# Patient Record
Sex: Female | Born: 1990 | Race: White | Hispanic: No | Marital: Married | Smoking: Never smoker
Health system: Southern US, Community
[De-identification: ages and names within clinical notes are randomized; demographics above are authoritative.]

## PROBLEM LIST (undated history)

## (undated) DIAGNOSIS — F329 Major depressive disorder, single episode, unspecified: Secondary | ICD-10-CM

## (undated) DIAGNOSIS — Z803 Family history of malignant neoplasm of breast: Secondary | ICD-10-CM

## (undated) DIAGNOSIS — Z8481 Family history of carrier of genetic disease: Secondary | ICD-10-CM

## (undated) DIAGNOSIS — Z8052 Family history of malignant neoplasm of bladder: Secondary | ICD-10-CM

## (undated) DIAGNOSIS — Z807 Family history of other malignant neoplasms of lymphoid, hematopoietic and related tissues: Secondary | ICD-10-CM

## (undated) DIAGNOSIS — F32A Depression, unspecified: Secondary | ICD-10-CM

## (undated) HISTORY — DX: Family history of malignant neoplasm of breast: Z80.3

## (undated) HISTORY — DX: Family history of carrier of genetic disease: Z84.81

## (undated) HISTORY — DX: Family history of malignant neoplasm of bladder: Z80.52

## (undated) HISTORY — PX: OTHER SURGICAL HISTORY: SHX169

## (undated) HISTORY — DX: Family history of other malignant neoplasms of lymphoid, hematopoietic and related tissues: Z80.7

## (undated) HISTORY — DX: Depression, unspecified: F32.A

## (undated) HISTORY — DX: Major depressive disorder, single episode, unspecified: F32.9

---

## 2017-06-27 ENCOUNTER — Other Ambulatory Visit: Payer: Self-pay

## 2017-06-27 ENCOUNTER — Ambulatory Visit (INDEPENDENT_AMBULATORY_CARE_PROVIDER_SITE_OTHER): Payer: 59 | Admitting: Family Medicine

## 2017-06-27 ENCOUNTER — Encounter: Payer: Self-pay | Admitting: Family Medicine

## 2017-06-27 VITALS — BP 110/68 | HR 102 | Temp 98.5°F | Ht 67.0 in | Wt 121.6 lb

## 2017-06-27 DIAGNOSIS — L309 Dermatitis, unspecified: Secondary | ICD-10-CM

## 2017-06-27 DIAGNOSIS — B354 Tinea corporis: Secondary | ICD-10-CM | POA: Diagnosis not present

## 2017-06-27 LAB — POCT SKIN KOH: SKIN KOH, POC: NEGATIVE

## 2017-06-27 MED ORDER — CLOTRIMAZOLE-BETAMETHASONE 1-0.05 % EX CREA: 1.0000 "application " | TOPICAL_CREAM | Freq: Two times a day (BID) | CUTANEOUS | 1 refills | Status: DC

## 2017-06-27 NOTE — Progress Notes (Signed)
Subjective:  By signing my name below, I, [name], attest that this documentation has been prepared under the direction and in the presence of [name], MD. Electronically Signed: [name], [place]. 06/27/2017 , 4:24 PM .  Patient was seen in Room 11 .   Patient ID: April Russo, female    DOB: 1990/09/25, 27 y.o.   MRN: [id number] Chief Complaint  Patient presents with  . Establish Care    ring worm on both hands. (red round spots and going on since begining of march)   HPI April Russo is a 27 y.o. female  Patient complains having an itchy rash over bilateral hands that started in March, and thought it was ringworm. She's applied lotrimin and OTC wash for her hands about 4 weeks intermittently now. She reports covering her hands with [employer]M brand waterproof bandaids and then nitrile gloves for work. She has history of eczema. She uses Dove body wash and generic hand soap.   She's noticed small patches over dorsum right forearm, right upper arm, and the insides of both ankles as well. She's noticed areas improved after a few days off work. 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   There are no active problems to display for this patient.  Past Medical History:  Diagnosis Date  . Depression     No Known Allergies Prior to Admission medications   Not on File   Social History   Socioeconomic History  . Marital status: Married    Spouse name: Not on file  . Number of children: Not on file  . Years of education: Not on file  . Highest education level: Not on file  Occupational History  . Not on file  Social Needs  . Financial resource strain: Not on file  . Food insecurity:    Worry: Not on file    Inability: Not on file  . Transportation needs:    Medical: Not on file    Non-medical: Not on file  Tobacco Use  . Smoking status: Never Smoker  . Smokeless tobacco: Never Used  Substance and Sexual Activity  . Alcohol use: Yes    Comment: [place]     . Drug use: Never  . Sexual activity: Yes  Lifestyle  . Physical activity:    Days per week: Not on file    Minutes per session: Not on file  . Stress: Not on file  Relationships  . Social connections:    Talks on phone: Not on file    Gets together: Not on file    Attends religious service: Not on file    Active member of club or organization: Not on file    Attends meetings of clubs or organizations: Not on file    Relationship status: Not on file  . Intimate partner violence:    Fear of current or ex partner: Not on file    Emotionally abused: Not on file    Physically abused: Not on file    Forced sexual activity: Not on file  Other Topics Concern  . Not on file  Social History Narrative  . Not on file   Review of Systems  Constitutional: Negative for chills, fatigue, fever and unexpected weight change.  Respiratory: Negative for cough.   Gastrointestinal: Negative for constipation, diarrhea, nausea and vomiting.  Skin: Positive for rash. Negative for wound.  Neurological: Negative for dizziness, weakness and headaches.       Objective:   Physical Exam  Constitutional: She is oriented to person, place, and time. She appears well-developed and well-nourished. No distress.  HENT:  Head: Normocephalic and atraumatic.  Eyes: Pupils are equal, round, and reactive to light. EOM are normal.  Neck: Neck supple.  Cardiovascular: Normal rate.  Pulmonary/Chest: Effort normal. No respiratory distress.  Musculoskeletal: Normal range of motion.  Neurological: She is alert and oriented to person, place, and time.  Skin: Skin is warm and dry. Rash noted.  Dorsum of hands: faint erythematous patch over left 5th finger, 2 very faint erythematous patches with slight central clearing; smaller patch at base of left thumb Right hand: larger patch of faint erythema, slight central clearing at the dorsum of the hand before the index finger, smaller patch over the 4th and 5th metacarpal;  no interdigital lesions Right forearm, dorsum: faint healing patch that measures about 1cm across Right upper arm: faint healing patch that measures about 20mm across  Psychiatric: She has a normal mood and affect. Her behavior is normal.  Nursing note and vitals reviewed.   Vitals:   06/27/17 1541  BP: 110/68  Pulse: (!) 102  Temp: 98.5 F (36.9 C)  TempSrc: Oral  SpO2: 99%  Weight: 121 lb 9.6 oz (55.2 kg)  Height: 5\' 7"  (1.702 m)   Results for orders placed or performed in visit on 06/27/17  POCT Skin KOH  Result Value Ref Range   Skin KOH, POC Negative Negative       Assessment & Plan:    April Russo is a 27 y.o. female Hand dermatitis - Plan: clotrimazole-betamethasone (LOTRISONE) cream, POCT Skin KOH  Tinea corporis - Plan: clotrimazole-betamethasone (LOTRISONE) cream, POCT Skin KOH  -Based on location, suspect some component of hand dermatitis, but does have other areas that may be tinea corporis.  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 Initially can try Lotrisone cream as steroid component may help hand dermatitis while also covering for tinea.  -Try to allow air to area as much as possible throughout the day  -Handout given on hand dermatitis as well as tinea  Recheck in the next 2 weeks.  Sooner if worse  Meds ordered this encounter  Medications  . clotrimazole-betamethasone (LOTRISONE) cream    Sig: Apply 1 application topically 2 (two) times daily.    Dispense:  30 g    Refill:  1   Patient Instructions    Try the Lotrisone cream twice per day to affected areas on hands and recheck in 2 weeks.  Try to leave those areas open to air as much as possible when not needed to be covered at work.  If any worsening symptoms, return sooner.  Thank you for coming in today.   Body Ringworm Body ringworm is an infection of the  skin that often causes a ring-shaped rash. Body ringworm can affect any part of your skin. It can spread easily to others. Body ringworm is also called tinea corporis. What are the causes? This condition is caused by funguses called dermatophytes. The condition develops when these funguses grow out of control on the skin. You can get this condition if you touch a person or animal that has it. You can also get it if you share clothing, bedding, towels, or any other object with an infected person or pet. What increases the risk? This condition is more likely to develop in:  Athletes who often make skin-to-skin contact with other athletes, such as wrestlers.  People who share equipment and mats.  People with a weakened immune system.  What are the signs or symptoms? Symptoms of this condition include:  Itchy, raised red spots and bumps.  Red scaly patches.  A ring-shaped rash. The rash may have: ? A clear center. ? Scales or red bumps at its center. ? Redness near its borders. ? Dry and scaly skin on or around it.  How is this diagnosed? This condition can usually be diagnosed with a skin exam. A skin scraping may be taken from the affected area and examined under a microscope to see if the fungus is present. How is this treated? This condition may be treated with:  An antifungal cream or ointment.  An antifungal shampoo.  Antifungal medicines. These may be prescribed if your ringworm is severe, keeps coming back, or lasts a long time.  Follow these instructions at home:  Take over-the-counter and prescription medicines only as told by your health care provider.  If you were given an antifungal cream or ointment: ? Use it as told by your health care provider. ? Wash the infected area and dry it completely before applying the cream or ointment.  If you were given an antifungal shampoo: ? Use it as told by your health care provider. ? Leave the shampoo on your body for 3-5  minutes before rinsing.  While you have a rash: ? Wear loose clothing to stop clothes from rubbing and irritating it. ? Wash or change your bed sheets every night.  If your pet has the same infection, take your pet to see a Animal nutritionist. How is this prevented?  Practice good hygiene.  Wear sandals or shoes in public places and showers.  Do not share personal items with others.  Avoid touching red patches of skin on other people.  Avoid touching pets that have bald spots.  If you touch an animal that has a bald spot, wash your hands. Contact a health care provider if:  Your rash continues to spread after 7 days of treatment.  Your rash is not gone in 4 weeks.  The area around your rash gets red, warm, tender, and swollen. This information is not intended to replace advice given to you by your health care provider. Make sure you discuss any questions you have with your health care provider. Document Released: 01/30/2000 Document Revised: 07/10/2015 Document Reviewed: 11/28/2014 Elsevier Interactive Patient Education  2018 [place] Dermatitis Hand dermatitis is a skin condition that causes small, itchy, raised dots or fluid-filled blisters to form over the palms of the hands. This condition may also be called hand eczema. What are the causes? The cause of this condition is not known. What increases the risk? This condition is more likely to develop in people who have a history of allergies, such as:  Hay fever.  Allergic asthma.  An allergy to latex.  Chemical exposure, injuries, and environmental irritants can make hand dermatitis worse. Washing your hands too often can remove natural oils, which can dry out the skin and contribute to outbreaks of this condition. What are the signs or symptoms? The most common symptom of this condition is intense itchiness. Cracks or grooves (fissures) on the fingers can also develop. Affected areas can be painful, especially  areas where large blisters have formed. How is this diagnosed? This condition is diagnosed with a medical history and physical exam. How is this treated?  This condition is treated with medicines, including:  Steroid creams and ointments.  Oral steroid medicines.  Antibiotic medicines. These are prescribed if you have an infection.  Antihistamine medicines. These help to reduce itchiness.  Follow these instructions at home:  Take or apply over-the-counter and prescription medicines only as told by your health care provider.  If you were prescribed an antibiotic medicine, use it as told by your health care provider. Do not stop using the antibiotic even if you start to feel better.  Avoid washing your hands more often than necessary.  Avoid using harsh chemicals on your hands.  Wear protective gloves when you handle products that can irritate your skin.  Keep all follow-up visits as told by your health care provider. This is important. Contact a health care provider if:  Your rash does not improve during the first week of treatment.  Your rash is red or tender.  Your rash has pus coming from it.  Your rash spreads. This information is not intended to replace advice given to you by your health care provider. Make sure you discuss any questions you have with your health care provider. Document Released: 02/01/2005 Document Revised: 07/10/2015 Document Reviewed: 08/16/2014 Elsevier Interactive Patient Education  2018 [employer].   IF you received an x-ray today, you will receive an invoice from [facility]. Please contact [facility] at [phone] with questions or concerns regarding your invoice.   IF you received labwork today, you will receive an invoice from [place]. Please contact [facility] at [phone] with questions or concerns regarding your invoice.   Our billing staff will not be able to assist you with questions regarding bills from these  companies.  You will be contacted with the lab results as soon as they are available. The fastest way to get your results is to activate your My Chart account. Instructions are located on the last page of this paperwork. If you have not heard from [place] regarding the results in 2 weeks, please contact this office.       I personally performed the services described in this documentation, which was scribed in my presence. The recorded information has been reviewed and considered for accuracy and completeness, addended by me as needed, and agree with information above.  Signed,   [name], MD [facility].  06/29/17 2:28 PM

## 2017-06-27 NOTE — Patient Instructions (Addendum)
Try the Lotrisone cream twice per day to affected areas on hands and recheck in 2 weeks.  Try to leave those areas open to air as much as possible when not needed to be covered at work.  If any worsening symptoms, return sooner.  Thank you for coming in today.   Body Ringworm Body ringworm is an infection of the skin that often causes a ring-shaped rash. Body ringworm can affect any part of your skin. It can spread easily to others. Body ringworm is also called tinea corporis. What are the causes? This condition is caused by funguses called dermatophytes. The condition develops when these funguses grow out of control on the skin. You can get this condition if you touch a person or animal that has it. You can also get it if you share clothing, bedding, towels, or any other object with an infected person or pet. What increases the risk? This condition is more likely to develop in:  Athletes who often make skin-to-skin contact with other athletes, such as wrestlers.  People who share equipment and mats.  People with a weakened immune system.  What are the signs or symptoms? Symptoms of this condition include:  Itchy, raised red spots and bumps.  Red scaly patches.  A ring-shaped rash. The rash may have: ? A clear center. ? Scales or red bumps at its center. ? Redness near its borders. ? Dry and scaly skin on or around it.  How is this diagnosed? This condition can usually be diagnosed with a skin exam. A skin scraping may be taken from the affected area and examined under a microscope to see if the fungus is present. How is this treated? This condition may be treated with:  An antifungal cream or ointment.  An antifungal shampoo.  Antifungal medicines. These may be prescribed if your ringworm is severe, keeps coming back, or lasts a long time.  Follow these instructions at home:  Take over-the-counter and prescription medicines only as told by your health care  provider.  If you were given an antifungal cream or ointment: ? Use it as told by your health care provider. ? Wash the infected area and dry it completely before applying the cream or ointment.  If you were given an antifungal shampoo: ? Use it as told by your health care provider. ? Leave the shampoo on your body for 3-5 minutes before rinsing.  While you have a rash: ? Wear loose clothing to stop clothes from rubbing and irritating it. ? Wash or change your bed sheets every night.  If your pet has the same infection, take your pet to see a Animal nutritionist. How is this prevented?  Practice good hygiene.  Wear sandals or shoes in public places and showers.  Do not share personal items with others.  Avoid touching red patches of skin on other people.  Avoid touching pets that have bald spots.  If you touch an animal that has a bald spot, wash your hands. Contact a health care provider if:  Your rash continues to spread after 7 days of treatment.  Your rash is not gone in 4 weeks.  The area around your rash gets red, warm, tender, and swollen. This information is not intended to replace advice given to you by your health care provider. Make sure you discuss any questions you have with your health care provider. Document Released: 01/30/2000 Document Revised: 07/10/2015 Document Reviewed: 11/28/2014 Elsevier Interactive Patient Education  2018 [place] Dermatitis Hand  dermatitis is a skin condition that causes small, itchy, raised dots or fluid-filled blisters to form over the palms of the hands. This condition may also be called hand eczema. What are the causes? The cause of this condition is not known. What increases the risk? This condition is more likely to develop in people who have a history of allergies, such as:  Hay fever.  Allergic asthma.  An allergy to latex.  Chemical exposure, injuries, and environmental irritants can make hand dermatitis  worse. Washing your hands too often can remove natural oils, which can dry out the skin and contribute to outbreaks of this condition. What are the signs or symptoms? The most common symptom of this condition is intense itchiness. Cracks or grooves (fissures) on the fingers can also develop. Affected areas can be painful, especially areas where large blisters have formed. How is this diagnosed? This condition is diagnosed with a medical history and physical exam. How is this treated? This condition is treated with medicines, including:  Steroid creams and ointments.  Oral steroid medicines.  Antibiotic medicines. These are prescribed if you have an infection.  Antihistamine medicines. These help to reduce itchiness.  Follow these instructions at home:  Take or apply over-the-counter and prescription medicines only as told by your health care provider.  If you were prescribed an antibiotic medicine, use it as told by your health care provider. Do not stop using the antibiotic even if you start to feel better.  Avoid washing your hands more often than necessary.  Avoid using harsh chemicals on your hands.  Wear protective gloves when you handle products that can irritate your skin.  Keep all follow-up visits as told by your health care provider. This is important. Contact a health care provider if:  Your rash does not improve during the first week of treatment.  Your rash is red or tender.  Your rash has pus coming from it.  Your rash spreads. This information is not intended to replace advice given to you by your health care provider. Make sure you discuss any questions you have with your health care provider. Document Released: 02/01/2005 Document Revised: 07/10/2015 Document Reviewed: 08/16/2014 Elsevier Interactive Patient Education  2018 [employer].   IF you received an x-ray today, you will receive an invoice from [facility]. Please contact [facility]  Radiology at [phone] with questions or concerns regarding your invoice.   IF you received labwork today, you will receive an invoice from [place]. Please contact [facility] at [phone] with questions or concerns regarding your invoice.   Our billing staff will not be able to assist you with questions regarding bills from these companies.  You will be contacted with the lab results as soon as they are available. The fastest way to get your results is to activate your My Chart account. Instructions are located on the last page of this paperwork. If you have not heard from [place] regarding the results in 2 weeks, please contact this office.

## 2017-06-29 ENCOUNTER — Encounter: Payer: Self-pay | Admitting: Family Medicine

## 2017-07-14 ENCOUNTER — Ambulatory Visit (INDEPENDENT_AMBULATORY_CARE_PROVIDER_SITE_OTHER): Payer: 59 | Admitting: Family Medicine

## 2017-07-14 ENCOUNTER — Other Ambulatory Visit: Payer: Self-pay

## 2017-07-14 ENCOUNTER — Encounter: Payer: Self-pay | Admitting: Family Medicine

## 2017-07-14 VITALS — HR 84 | Temp 98.4°F | Ht 67.0 in | Wt 124.4 lb

## 2017-07-14 DIAGNOSIS — L309 Dermatitis, unspecified: Secondary | ICD-10-CM | POA: Diagnosis not present

## 2017-07-14 MED ORDER — TRIAMCINOLONE ACETONIDE 0.1 % EX CREA: 1.0000 "application " | TOPICAL_CREAM | Freq: Two times a day (BID) | CUTANEOUS | 1 refills | Status: DC

## 2017-07-14 NOTE — Patient Instructions (Addendum)
  Rash on hands appears much better.  Okay to use the Lotrisone for another week or so, but I also wrote for triamcinolone to use as needed in the future as this may be a type of hand eczema.  Thank you for coming in today.  Let me know if we can help further.   Hand Dermatitis Hand dermatitis is a skin condition that causes small, itchy, raised dots or fluid-filled blisters to form over the palms of the hands. This condition may also be called hand eczema. What are the causes? The cause of this condition is not known. What increases the risk? This condition is more likely to develop in people who have a history of allergies, such as:  Hay fever.  Allergic asthma.  An allergy to latex.  Chemical exposure, injuries, and environmental irritants can make hand dermatitis worse. Washing your hands too often can remove natural oils, which can dry out the skin and contribute to outbreaks of this condition. What are the signs or symptoms? The most common symptom of this condition is intense itchiness. Cracks or grooves (fissures) on the fingers can also develop. Affected areas can be painful, especially areas where large blisters have formed. How is this diagnosed? This condition is diagnosed with a medical history and physical exam. How is this treated? This condition is treated with medicines, including:  Steroid creams and ointments.  Oral steroid medicines.  Antibiotic medicines. These are prescribed if you have an infection.  Antihistamine medicines. These help to reduce itchiness.  Follow these instructions at home:  Take or apply over-the-counter and prescription medicines only as told by your health care provider.  If you were prescribed an antibiotic medicine, use it as told by your health care provider. Do not stop using the antibiotic even if you start to feel better.  Avoid washing your hands more often than necessary.  Avoid using harsh chemicals on your hands.  Wear  protective gloves when you handle products that can irritate your skin.  Keep all follow-up visits as told by your health care provider. This is important. Contact a health care provider if:  Your rash does not improve during the first week of treatment.  Your rash is red or tender.  Your rash has pus coming from it.  Your rash spreads. This information is not intended to replace advice given to you by your health care provider. Make sure you discuss any questions you have with your health care provider. Document Released: 02/01/2005 Document Revised: 07/10/2015 Document Reviewed: 08/16/2014 Elsevier Interactive Patient Education  2018 [employer].    IF you received an x-ray today, you will receive an invoice from [facility]. Please contact [facility] Radiology at [phone] with questions or concerns regarding your invoice.   IF you received labwork today, you will receive an invoice from [place]. Please contact [facility] at [phone] with questions or concerns regarding your invoice.   Our billing staff will not be able to assist you with questions regarding bills from these companies.  You will be contacted with the lab results as soon as they are available. The fastest way to get your results is to activate your My Chart account. Instructions are located on the last page of this paperwork. If you have not heard from [place] regarding the results in 2 weeks, please contact this office.

## 2017-07-14 NOTE — Progress Notes (Signed)
Subjective:  By signing my name below, I, April Russo, attest that this documentation has been prepared under the direction and in the presence of April Agreste, MD Electronically Signed: Ladene Artist, ED Scribe 07/14/2017 at 8:58 AM.   Patient ID: April Russo, female    DOB: Apr 23, 1990, 26 y.o.   MRN: [id number]  Chief Complaint  Patient presents with  . Rash    follow up   HPI April Russo is a 27 y.o. female who presents to Primary Care at [facility] for f/u on rash on hands. Suspected contact dermatitis, less likely tinea. Due to her job has to use occlusive bandages and gloves for pt care. Prescribed Lotrisone cream at visit 2 wks ago. Advised to allow skin to breathe as much as possible throughout the day. - Pt states that rash has improved significantly. She initially applied cream 2-3 times/daily, now applies it only at night. States she did have 3 days off work which she also thinks helped some.  There are no active problems to display for this patient.  Past Medical History:  Diagnosis Date  . Depression    Past Surgical History:  Procedure Laterality Date  . wisdom tooth removal Bilateral    No Known Allergies Prior to Admission medications   Medication Sig Start Date End Date Taking? Authorizing Provider  cetirizine (ZYRTEC) 10 MG tablet Take 10 mg by mouth as needed for allergies.   Yes [provider]  clotrimazole-betamethasone (LOTRISONE) cream Apply 1 application topically 2 (two) times daily. 06/27/17  Yes April Agreste, MD   Social History   Socioeconomic History  . Marital status: Married    Spouse name: Not on file  . Number of children: Not on file  . Years of education: Not on file  . Highest education level: Not on file  Occupational History  . Not on file  Social Needs  . Financial resource strain: Not on file  . Food insecurity:    Worry: Not on file    Inability: Not on file  . Transportation needs:    Medical: Not on file   Non-medical: Not on file  Tobacco Use  . Smoking status: Never Smoker  . Smokeless tobacco: Never Used  Substance and Sexual Activity  . Alcohol use: Yes    Comment: [place]   . Drug use: Never  . Sexual activity: Yes  Lifestyle  . Physical activity:    Days per week: Not on file    Minutes per session: Not on file  . Stress: Not on file  Relationships  . Social connections:    Talks on phone: Not on file    Gets together: Not on file    Attends religious service: Not on file    Active member of club or organization: Not on file    Attends meetings of clubs or organizations: Not on file    Relationship status: Not on file  . Intimate partner violence:    Fear of current or ex partner: Not on file    Emotionally abused: Not on file    Physically abused: Not on file    Forced sexual activity: Not on file  Other Topics Concern  . Not on file  Social History Narrative  . Not on file   Review of Systems  Skin: Positive for rash (improving).      Objective:   Physical Exam  Constitutional: She is oriented to person, place, and time. She appears well-developed and well-nourished. No  distress.  HENT:  Head: Normocephalic and atraumatic.  Eyes: Conjunctivae and EOM are normal.  Neck: Neck supple. No tracheal deviation present.  Cardiovascular: Normal rate.  Pulmonary/Chest: Effort normal. No respiratory distress.  Musculoskeletal: Normal range of motion.  Neurological: She is alert and oriented to person, place, and time.  Skin: Skin is warm and dry.  Dorsal hands bilaterally: very faint hypopigmentation patches. Otherwise skin appears intact. Palmar hands appear normal. No interdigital lesions.  Psychiatric: She has a normal mood and affect. Her behavior is normal.  Nursing note and vitals reviewed.  Vitals:   07/14/17 0828  Pulse: 84  Temp: 98.4 F (36.9 C)  TempSrc: Oral  SpO2: 97%  Weight: 124 lb 6.4 oz (56.4 kg)  Height: 5\' 7"  (1.702 m)      Assessment & Plan:     April Russo is a 27 y.o. female Hand dermatitis  -Significantly improved with Lotrisone.  Still suspect this most likely to be a hand dermatitis/eczema.  Okay to continue Lotrisone for the next week to 10 days, then can switch to triamcinolone as needed, which can also be used if needed for other areas of eczema. RTC precautions given.    Meds ordered this encounter  Medications  . triamcinolone cream (KENALOG) 0.1 %    Sig: Apply 1 application topically 2 (two) times daily. As needed    Dispense:  30 g    Refill:  1   Patient Instructions    Rash on hands appears much better.  Okay to use the Lotrisone for another week or so, but I also wrote for triamcinolone to use as needed in the future as this may be a type of hand eczema.  Thank you for coming in today.  Let me know if we can help further.   Hand Dermatitis Hand dermatitis is a skin condition that causes small, itchy, raised dots or fluid-filled blisters to form over the palms of the hands. This condition may also be called hand eczema. What are the causes? The cause of this condition is not known. What increases the risk? This condition is more likely to develop in people who have a history of allergies, such as:  Hay fever.  Allergic asthma.  An allergy to latex.  Chemical exposure, injuries, and environmental irritants can make hand dermatitis worse. Washing your hands too often can remove natural oils, which can dry out the skin and contribute to outbreaks of this condition. What are the signs or symptoms? The most common symptom of this condition is intense itchiness. Cracks or grooves (fissures) on the fingers can also develop. Affected areas can be painful, especially areas where large blisters have formed. How is this diagnosed? This condition is diagnosed with a medical history and physical exam. How is this treated? This condition is treated with medicines, including:  Steroid creams and  ointments.  Oral steroid medicines.  Antibiotic medicines. These are prescribed if you have an infection.  Antihistamine medicines. These help to reduce itchiness.  Follow these instructions at home:  Take or apply over-the-counter and prescription medicines only as told by your health care provider.  If you were prescribed an antibiotic medicine, use it as told by your health care provider. Do not stop using the antibiotic even if you start to feel better.  Avoid washing your hands more often than necessary.  Avoid using harsh chemicals on your hands.  Wear protective gloves when you handle products that can irritate your skin.  Keep all follow-up  visits as told by your health care provider. This is important. Contact a health care provider if:  Your rash does not improve during the first week of treatment.  Your rash is red or tender.  Your rash has pus coming from it.  Your rash spreads. This information is not intended to replace advice given to you by your health care provider. Make sure you discuss any questions you have with your health care provider. Document Released: 02/01/2005 Document Revised: 07/10/2015 Document Reviewed: 08/16/2014 Elsevier Interactive Patient Education  2018 [employer].    IF you received an x-ray today, you will receive an invoice from [facility]. Please contact [facility] at [phone] with questions or concerns regarding your invoice.   IF you received labwork today, you will receive an invoice from [place]. Please contact [facility] at [phone] with questions or concerns regarding your invoice.   Our billing staff will not be able to assist you with questions regarding bills from these companies.  You will be contacted with the lab results as soon as they are available. The fastest way to get your results is to activate your My Chart account. Instructions are located on the last page of this paperwork. If you  have not heard from [place] regarding the results in 2 weeks, please contact this office.       I personally performed the services described in this documentation, which was scribed in my presence. The recorded information has been reviewed and considered for accuracy and completeness, addended by me as needed, and agree with information above.  Signed,   [name], MD [facility] at [place].  07/14/17 9:03 AM

## 2018-02-03 ENCOUNTER — Other Ambulatory Visit: Payer: Self-pay

## 2018-02-03 ENCOUNTER — Ambulatory Visit (INDEPENDENT_AMBULATORY_CARE_PROVIDER_SITE_OTHER): Payer: 59 | Admitting: Emergency Medicine

## 2018-02-03 ENCOUNTER — Encounter: Payer: Self-pay | Admitting: Emergency Medicine

## 2018-02-03 VITALS — BP 98/65 | HR 88 | Temp 98.9°F | Resp 16 | Ht 67.25 in | Wt 123.6 lb

## 2018-02-03 DIAGNOSIS — R0981 Nasal congestion: Secondary | ICD-10-CM | POA: Insufficient documentation

## 2018-02-03 DIAGNOSIS — J029 Acute pharyngitis, unspecified: Secondary | ICD-10-CM | POA: Insufficient documentation

## 2018-02-03 DIAGNOSIS — R6889 Other general symptoms and signs: Secondary | ICD-10-CM | POA: Insufficient documentation

## 2018-02-03 DIAGNOSIS — J111 Influenza due to unidentified influenza virus with other respiratory manifestations: Secondary | ICD-10-CM

## 2018-02-03 LAB — POC INFLUENZA A&B (BINAX/QUICKVUE)
Influenza A, POC: POSITIVE — AB
Influenza B, POC: POSITIVE — AB

## 2018-02-03 LAB — POCT RAPID STREP A (OFFICE): Rapid Strep A Screen: NEGATIVE

## 2018-02-03 MED ORDER — PSEUDOEPHEDRINE-GUAIFENESIN ER 60-600 MG PO TB12: 1.0000 | ORAL_TABLET | Freq: Two times a day (BID) | ORAL | 1 refills | Status: AC

## 2018-02-03 NOTE — Progress Notes (Signed)
April Russo 27 y.o.   Chief Complaint  Patient presents with  . Cough    x 4days productive with yellow mucus  . Sore Throat    and Triamcinolone cream refill    HISTORY OF PRESENT ILLNESS: This is a 27 y.o. female complaining of flulike symptoms that started 4 days ago.  Complaining of tender throat along with joints hurting, generalized body aches, productive cough and general malaise.  HPI   Prior to Admission medications   Medication Sig Start Date End Date Taking? Authorizing Provider  clotrimazole-betamethasone (LOTRISONE) cream Apply 1 application topically 2 (two) times daily. 06/27/17  Yes [name], MD  triamcinolone cream (KENALOG) 0.1 % Apply 1 application topically 2 (two) times daily. As needed 07/14/17  Yes [name], MD  cetirizine (ZYRTEC) 10 MG tablet Take 10 mg by mouth as needed for allergies.    [provider]    No Known Allergies  There are no active problems to display for this patient.   Past Medical History:  Diagnosis Date  . Depression     Past Surgical History:  Procedure Laterality Date  . wisdom tooth removal Bilateral     Social History   Socioeconomic History  . Marital status: Married    Spouse name: Not on file  . Number of children: Not on file  . Years of education: Not on file  . Highest education level: Not on file  Occupational History  . Not on file  Social Needs  . Financial resource strain: Not on file  . Food insecurity:    Worry: Not on file    Inability: Not on file  . Transportation needs:    Medical: Not on file    Non-medical: Not on file  Tobacco Use  . Smoking status: Never Smoker  . Smokeless tobacco: Never Used  Substance and Sexual Activity  . Alcohol use: Yes    Comment: [place]   . Drug use: Never  . Sexual activity: Yes  Lifestyle  . Physical activity:    Days per week: Not on file    Minutes per session: Not on file  . Stress: Not on file  Relationships  . Social  connections:    Talks on phone: Not on file    Gets together: Not on file    Attends religious service: Not on file    Active member of club or organization: Not on file    Attends meetings of clubs or organizations: Not on file    Relationship status: Not on file  . Intimate partner violence:    Fear of current or ex partner: Not on file    Emotionally abused: Not on file    Physically abused: Not on file    Forced sexual activity: Not on file  Other Topics Concern  . Not on file  Social History Narrative  . Not on file    Family History  Problem Relation Age of Onset  . Cancer Maternal Grandmother   . Cancer Maternal Grandfather   . Cancer Paternal Grandmother   . Heart disease Paternal Grandfather      Review of Systems  Constitutional: Positive for malaise/fatigue. Negative for fever.  HENT: Positive for sore throat.   Eyes: Negative.  Negative for discharge and redness.  Respiratory: Positive for cough.   Cardiovascular: Negative.  Negative for chest pain and palpitations.  Gastrointestinal: Negative.  Negative for abdominal pain, diarrhea, nausea and vomiting.  Genitourinary: Negative.   Musculoskeletal:  Positive for joint pain and myalgias.  Skin: Negative.   Neurological: Negative for dizziness and headaches.  Endo/Heme/Allergies: Negative.   All other systems reviewed and are negative.   Vitals:   02/03/18 0950  BP: 98/65  Pulse: 88  Resp: 16  Temp: 98.9 F (37.2 C)  SpO2: 99%    Physical Exam Vitals signs reviewed.  Constitutional:      Appearance: She is well-developed.  HENT:     Head: Normocephalic and atraumatic.     Mouth/Throat:     Mouth: Mucous membranes are dry.     Pharynx: Uvula midline. Posterior oropharyngeal erythema present. No pharyngeal swelling or oropharyngeal exudate.  Eyes:     Conjunctiva/sclera: Conjunctivae normal.     Pupils: Pupils are equal, round, and reactive to light.  Neck:     Musculoskeletal: Normal range of  motion and neck supple.  Cardiovascular:     Rate and Rhythm: Normal rate and regular rhythm.     Heart sounds: Normal heart sounds. No murmur.  Pulmonary:     Effort: Pulmonary effort is normal.     Breath sounds: Normal breath sounds.  Lymphadenopathy:     Cervical: No cervical adenopathy.  Skin:    General: Skin is warm and dry.     Capillary Refill: Capillary refill takes less than 2 seconds.  Neurological:     General: No focal deficit present.     Mental Status: She is alert and oriented to person, place, and time.  Psychiatric:        Mood and Affect: Mood normal.        Behavior: Behavior normal.    Results for orders placed or performed in visit on 02/03/18 (from the past 24 hour(s))  POCT rapid strep A     Status: None   Collection Time: 02/03/18 10:23 AM  Result Value Ref Range   Rapid Strep A Screen Negative Negative  POC Influenza A&B(BINAX/QUICKVUE)     Status: Abnormal   Collection Time: 02/03/18 10:55 AM  Result Value Ref Range   Influenza A, POC Positive (A) Negative   Influenza B, POC Positive (A) Negative     ASSESSMENT & PLAN: April Russo was seen today for cough and sore throat.  Diagnoses and all orders for this visit:  Influenza  Sore throat -     Culture, Group A Strep -     POCT rapid strep A  Flu-like symptoms -     POC Influenza A&B(BINAX/QUICKVUE)  Sinus congestion -     pseudoephedrine-guaifenesin (MUCINEX D) 60-600 MG 12 hr tablet; Take 1 tablet by mouth every 12 (twelve) hours for 5 days.    Patient Instructions       If you have lab work done today you will be contacted with your lab results within the next 2 weeks.  If you have not heard from [place] then please contact us. The fastest way to get your results is to register for My Chart.   IF you received an x-ray today, you will receive an invoice from [facility]. Please contact [facility] at [phone] with questions or concerns regarding your invoice.   IF  you received labwork today, you will receive an invoice from [place]. Please contact [facility] at [phone] with questions or concerns regarding your invoice.   Our billing staff will not be able to assist you with questions regarding bills from these companies.  You will be contacted with the lab results as soon as they are available.  The fastest way to get your results is to activate your My Chart account. Instructions are located on the last page of this paperwork. If you have not heard from [place] regarding the results in 2 weeks, please contact this office.     Influenza, Adult Influenza is also called "the flu." It is an infection in the lungs, nose, and throat (respiratory tract). It is caused by a virus. The flu causes symptoms that are similar to symptoms of a cold. It also causes a high fever and body aches. The flu spreads easily from person to person (is contagious). Getting a flu shot (influenza vaccination) every year is the best way to prevent the flu. What are the causes? This condition is caused by the influenza virus. You can get the virus by:  Breathing in droplets that are in the air from the cough or sneeze of a person who has the virus.  Touching something that has the virus on it (is contaminated) and then touching your mouth, nose, or eyes. What increases the risk? Certain things may make you more likely to get the flu. These include:  Not washing your hands often.  Having close contact with many people during cold and flu season.  Touching your mouth, eyes, or nose without first washing your hands.  Not getting a flu shot every year. You may have a higher risk for the flu, along with serious problems such as a lung infection (pneumonia), if you:  Are older than 65.  Are pregnant.  Have a weakened disease-fighting system (immune system) because of a disease or taking certain medicines.  Have a long-term (chronic) illness, such as: ? Heart, kidney, or lung  disease. ? Diabetes. ? Asthma.  Have a liver disorder.  Are very overweight (morbidly obese).  Have anemia. This is a condition that affects your red blood cells. What are the signs or symptoms? Symptoms usually begin suddenly and last 4-14 days. They may include:  Fever and chills.  Headaches, body aches, or muscle aches.  Sore throat.  Cough.  Runny or stuffy (congested) nose.  Chest discomfort.  Not wanting to eat as much as normal (poor appetite).  Weakness or feeling tired (fatigue).  Dizziness.  Feeling sick to your stomach (nauseous) or throwing up (vomiting). How is this treated? If the flu is found early, you can be treated with medicine that can help reduce how bad the illness is and how long it lasts (antiviral medicine). This may be given by mouth (orally) or through an IV tube. Taking care of yourself at home can help your symptoms get better. Your doctor may suggest:  Taking over-the-counter medicines.  Drinking plenty of fluids. The flu often goes away on its own. If you have very bad symptoms or other problems, you may be treated in a hospital. Follow these instructions at home:     Activity  Rest as needed. Get plenty of sleep.  Stay home from work or school as told by your doctor. ? Do not leave home until you do not have a fever for 24 hours without taking medicine. ? Leave home only to visit your doctor. Eating and drinking  Take an ORS (oral rehydration solution). This is a drink that is sold at pharmacies and stores.  Drink enough fluid to keep your pee (urine) pale yellow.  Drink clear fluids in small amounts as you are able. Clear fluids include: ? Water. ? Ice chips. ? Fruit juice that has water added (diluted fruit  juice). ? Low-calorie sports drinks.  Eat bland, easy-to-digest foods in small amounts as you are able. These foods include: ? Bananas. ? Applesauce. ? Rice. ? Lean meats. ? Toast. ? Crackers.  Do not eat or  drink: ? Fluids that have a lot of sugar or caffeine. ? Alcohol. ? Spicy or fatty foods. General instructions  Take over-the-counter and prescription medicines only as told by your doctor.  Use a cool mist humidifier to add moisture to the air in your home. This can make it easier for you to breathe.  Cover your mouth and nose when you cough or sneeze.  Wash your hands with soap and water often, especially after you cough or sneeze. If you cannot use soap and water, use alcohol-based hand sanitizer.  Keep all follow-up visits as told by your doctor. This is important. How is this prevented?   Get a flu shot every year. You may get the flu shot in late summer, fall, or winter. Ask your doctor when you should get your flu shot.  Avoid contact with people who are sick during fall and winter (cold and flu season). Contact a doctor if:  You get new symptoms.  You have: ? Chest pain. ? Watery poop (diarrhea). ? A fever.  Your cough gets worse.  You start to have more mucus.  You feel sick to your stomach.  You throw up. Get help right away if you:  Have shortness of breath.  Have trouble breathing.  Have skin or nails that turn a bluish color.  Have very bad pain or stiffness in your neck.  Get a sudden headache.  Get sudden pain in your face or ear.  Cannot eat or drink without throwing up. Summary  Influenza ("the flu") is an infection in the lungs, nose, and throat. It is caused by a virus.  Take over-the-counter and prescription medicines only as told by your doctor.  Getting a flu shot every year is the best way to avoid getting the flu. This information is not intended to replace advice given to you by your health care provider. Make sure you discuss any questions you have with your health care provider. Document Released: 11/11/2007 Document Revised: 07/20/2017 Document Reviewed: 07/20/2017 Elsevier Interactive Patient Education  2019 [employer]      Agustina Caroli, MD [facility]

## 2018-02-03 NOTE — Patient Instructions (Addendum)
   If you have lab work done today you will be contacted with your lab results within the next 2 weeks.  If you have not heard from us then please contact us. The fastest way to get your results is to register for My Chart.   IF you received an x-ray today, you will receive an invoice from [facility]. Please contact [facility] at [phone] with questions or concerns regarding your invoice.   IF you received labwork today, you will receive an invoice from [facility]. Please contact [facility] at [phone] with questions or concerns regarding your invoice.   Our billing staff will not be able to assist you with questions regarding bills from these companies.  You will be contacted with the lab results as soon as they are available. The fastest way to get your results is to activate your My Chart account. Instructions are located on the last page of this paperwork. If you have not heard from us regarding the results in 2 weeks, please contact this office.     Influenza, Adult Influenza is also called "the flu." It is an infection in the lungs, nose, and throat (respiratory tract). It is caused by a virus. The flu causes symptoms that are similar to symptoms of a cold. It also causes a high fever and body aches. The flu spreads easily from person to person (is contagious). Getting a flu shot (influenza vaccination) every year is the best way to prevent the flu. What are the causes? This condition is caused by the influenza virus. You can get the virus by:  Breathing in droplets that are in the air from the cough or sneeze of a person who has the virus.  Touching something that has the virus on it (is contaminated) and then touching your mouth, nose, or eyes. What increases the risk? Certain things may make you more likely to get the flu. These include:  Not washing your hands often.  Having close contact with many people during cold and flu season.  Touching your  mouth, eyes, or nose without first washing your hands.  Not getting a flu shot every year. You may have a higher risk for the flu, along with serious problems such as a lung infection (pneumonia), if you:  Are older than 65.  Are pregnant.  Have a weakened disease-fighting system (immune system) because of a disease or taking certain medicines.  Have a long-term (chronic) illness, such as: ? Heart, kidney, or lung disease. ? Diabetes. ? Asthma.  Have a liver disorder.  Are very overweight (morbidly obese).  Have anemia. This is a condition that affects your red blood cells. What are the signs or symptoms? Symptoms usually begin suddenly and last 4-14 days. They may include:  Fever and chills.  Headaches, body aches, or muscle aches.  Sore throat.  Cough.  Runny or stuffy (congested) nose.  Chest discomfort.  Not wanting to eat as much as normal (poor appetite).  Weakness or feeling tired (fatigue).  Dizziness.  Feeling sick to your stomach (nauseous) or throwing up (vomiting). How is this treated? If the flu is found early, you can be treated with medicine that can help reduce how bad the illness is and how long it lasts (antiviral medicine). This may be given by mouth (orally) or through an IV tube. Taking care of yourself at home can help your symptoms get better. Your doctor may suggest:  Taking over-the-counter medicines.  Drinking plenty of fluids. The flu often   goes away on its own. If you have very bad symptoms or other problems, you may be treated in a hospital. Follow these instructions at home:     Activity  Rest as needed. Get plenty of sleep.  Stay home from work or school as told by your doctor. ? Do not leave home until you do not have a fever for 24 hours without taking medicine. ? Leave home only to visit your doctor. Eating and drinking  Take an ORS (oral rehydration solution). This is a drink that is sold at pharmacies and  stores.  Drink enough fluid to keep your pee (urine) pale yellow.  Drink clear fluids in small amounts as you are able. Clear fluids include: ? Water. ? Ice chips. ? Fruit juice that has water added (diluted fruit juice). ? Low-calorie sports drinks.  Eat bland, easy-to-digest foods in small amounts as you are able. These foods include: ? Bananas. ? Applesauce. ? Rice. ? Lean meats. ? Toast. ? Crackers.  Do not eat or drink: ? Fluids that have a lot of sugar or caffeine. ? Alcohol. ? Spicy or fatty foods. General instructions  Take over-the-counter and prescription medicines only as told by your doctor.  Use a cool mist humidifier to add moisture to the air in your home. This can make it easier for you to breathe.  Cover your mouth and nose when you cough or sneeze.  Wash your hands with soap and water often, especially after you cough or sneeze. If you cannot use soap and water, use alcohol-based hand sanitizer.  Keep all follow-up visits as told by your doctor. This is important. How is this prevented?   Get a flu shot every year. You may get the flu shot in late summer, fall, or winter. Ask your doctor when you should get your flu shot.  Avoid contact with people who are sick during fall and winter (cold and flu season). Contact a doctor if:  You get new symptoms.  You have: ? Chest pain. ? Watery poop (diarrhea). ? A fever.  Your cough gets worse.  You start to have more mucus.  You feel sick to your stomach.  You throw up. Get help right away if you:  Have shortness of breath.  Have trouble breathing.  Have skin or nails that turn a bluish color.  Have very bad pain or stiffness in your neck.  Get a sudden headache.  Get sudden pain in your face or ear.  Cannot eat or drink without throwing up. Summary  Influenza ("the flu") is an infection in the lungs, nose, and throat. It is caused by a virus.  Take over-the-counter and prescription  medicines only as told by your doctor.  Getting a flu shot every year is the best way to avoid getting the flu. This information is not intended to replace advice given to you by your health care provider. Make sure you discuss any questions you have with your health care provider. Document Released: 11/11/2007 Document Revised: 07/20/2017 Document Reviewed: 07/20/2017 [employer] Interactive Patient Education  2019 [employer]  

## 2018-02-06 LAB — CULTURE, GROUP A STREP: STREP A CULTURE: NEGATIVE

## 2018-03-26 ENCOUNTER — Encounter: Payer: Self-pay | Admitting: Family Medicine

## 2018-03-28 MED ORDER — TRIAMCINOLONE ACETONIDE 0.1 % EX CREA: 1.0000 "application " | TOPICAL_CREAM | Freq: Two times a day (BID) | CUTANEOUS | 1 refills | Status: DC

## 2018-04-27 ENCOUNTER — Ambulatory Visit: Payer: Self-pay | Admitting: *Deleted

## 2018-04-27 NOTE — Telephone Encounter (Signed)
Noted  

## 2018-04-27 NOTE — Telephone Encounter (Signed)
Pt called with complaints of dizziness that started on 04/24/2018; she says that the 1st time she noticed it she was putting in eye drops; she says that the dizziness is intermittent; she says that it is most frequent with head position changes, but it has happened without changing positions; the pt normally sees Dr Carlota Raspberry, and she would like to to be seen in the office today by any provider; there is no availbility in the office today; she says that she has to work 7a-7p on 04/28/2018; the pt states that she will go to urgent care; will route to office for notificaton.      Reason for Disposition . [1] MODERATE dizziness (e.g., interferes with normal activities) AND [2] has NOT been evaluated by physician for this  (Exception: dizziness caused by heat exposure, sudden standing, or poor fluid intake)  Answer Assessment - Initial Assessment Questions 1. DESCRIPTION: "Describe your dizziness."     Intermittent room spinning and tilting; lightheaded  2. LIGHTHEADED: "Do you feel lightheaded?" (e.g., somewhat faint, woozy, weak upon standing)     yes 3. VERTIGO: "Do you feel like either you or the room is spinning or tilting?" (i.e. vertigo)     yes 4. SEVERITY: "How bad is it?"  "Do you feel like you are going to faint?" "Can you stand and walk?"   - MILD - walking normally   - MODERATE - interferes with normal activities (e.g., work, school)    - SEVERE - unable to stand, requires support to walk, feels like passing out now.      Mild to moderate 5. ONSET:  "When did the dizziness begin?"     04/24/2018 6. AGGRAVATING FACTORS: "Does anything make it worse?" (e.g., standing, change in head position)     Changing position of her head at times 7. HEART RATE: "Can you tell me your heart rate?" "How many beats in 15 seconds?"  (Note: not all patients can do this)       23 beats in 15 seconds  8. CAUSE: "What do you think is causing the dizziness?"     Not sure 9. RECURRENT SYMPTOM: "Have you had  dizziness before?" If so, ask: "When was the last time?" "What happened that time?"     no 10. OTHER SYMPTOMS: "Do you have any other symptoms?" (e.g., fever, chest pain, vomiting, diarrhea, bleeding)       Pt stumbled once on 04/26/2018 11. PREGNANCY: "Is there any chance you are pregnant?" "When was your last menstrual period?"       LMP 04/04/2018; IUD  Protocols used: DIZZINESS [facility]

## 2018-07-18 ENCOUNTER — Encounter: Payer: 59 | Admitting: Family Medicine

## 2018-07-26 ENCOUNTER — Ambulatory Visit (INDEPENDENT_AMBULATORY_CARE_PROVIDER_SITE_OTHER): Payer: 59 | Admitting: Family Medicine

## 2018-07-26 ENCOUNTER — Other Ambulatory Visit: Payer: Self-pay

## 2018-07-26 ENCOUNTER — Encounter: Payer: Self-pay | Admitting: Family Medicine

## 2018-07-26 VITALS — BP 120/70 | HR 77 | Temp 98.7°F | Resp 12 | Ht 67.0 in | Wt 126.4 lb

## 2018-07-26 DIAGNOSIS — Z0001 Encounter for general adult medical examination with abnormal findings: Secondary | ICD-10-CM | POA: Diagnosis not present

## 2018-07-26 DIAGNOSIS — Z111 Encounter for screening for respiratory tuberculosis: Secondary | ICD-10-CM | POA: Diagnosis not present

## 2018-07-26 DIAGNOSIS — L309 Dermatitis, unspecified: Secondary | ICD-10-CM

## 2018-07-26 DIAGNOSIS — F439 Reaction to severe stress, unspecified: Secondary | ICD-10-CM

## 2018-07-26 DIAGNOSIS — Z0000 Encounter for general adult medical examination without abnormal findings: Secondary | ICD-10-CM

## 2018-07-26 NOTE — Progress Notes (Signed)
Subjective:    Patient ID: April Russo, female    DOB: 1990/11/28, 28 y.o.   MRN: [id number]  HPI April Russo is a 28 y.o. female Presents today for: Chief Complaint  Patient presents with  . Annual Exam    Patient is here today to have an annual physical but also need to have college form filled out and a quantifron tb test   Here for annual exam, and form for [employer] school of nursing. Summer classes for nursing.  Currently nurse tech.  Will also need QuantiFERON gold testing for tuberculosis screening.  Last quantiferon screening through [facility] in 2019. Working at [employer]. Has been staying healthy.   Cancer screening: Cervical cancer screening - last month with Dr. Royston Sinner, normal. On seasonique for contraception. Min initial nausea. Some breakthrough bleeding.  Will reassess through 3 month.  FH: maternal aunt - breast CA, had BRCA mutation. Considering this testing.  Last tdap 2014.  Has not had HPV vaccine - married 3 years. Declines vaccine at this time.    There is no immunization history on file for this patient.  Depression screen [facility] 2/9 07/26/2018 07/14/2017 06/27/2017  Decreased Interest 0 0 0  Down, Depressed, Hopeless 0 0 0  PHQ - 2 Score 0 0 0  some stress with work and pandemic, school year with online work.  Planning on discussion with EAP.  Sleeping ok most of the time.  Feels like needs at least 9 hrs per night  No exam data present  Dental: no regular dentist. Tried one in town, but not happy there.   Exercise:sporadic, 14 hour shifts walking- physical job.   Alcohol - 2-3 drinks per week.  No tobacco.   Prior seasonal allergies - no regular meds.   Hand dermatitis: See prior notes. Doing well overall Occasional small flairs when working.  uses kenalog to affected areas only for a few days only.   Patient Active Problem List   Diagnosis Date Noted  . Influenza 02/03/2018  . Flu-like symptoms 02/03/2018  . Sore throat 02/03/2018  .  Sinus congestion 02/03/2018   Past Medical History:  Diagnosis Date  . Depression    Past Surgical History:  Procedure Laterality Date  . wisdom tooth removal Bilateral    No Known Allergies Prior to Admission medications   Medication Sig Start Date End Date Taking? Authorizing Provider  cetirizine (ZYRTEC) 10 MG tablet Take 10 mg by mouth as needed for allergies.   Yes [provider]  triamcinolone cream (KENALOG) 0.1 % Apply 1 application topically 2 (two) times daily. As needed 03/28/18  Yes [name], MD   Social History   Socioeconomic History  . Marital status: Married    Spouse name: Not on file  . Number of children: Not on file  . Years of education: Not on file  . Highest education level: Not on file  Occupational History  . Not on file  Social Needs  . Financial resource strain: Not on file  . Food insecurity:    Worry: Not on file    Inability: Not on file  . Transportation needs:    Medical: Not on file    Non-medical: Not on file  Tobacco Use  . Smoking status: Never Smoker  . Smokeless tobacco: Never Used  Substance and Sexual Activity  . Alcohol use: Yes    Comment: [place]   . Drug use: Never  . Sexual activity: Yes  Lifestyle  . Physical activity:  Days per week: Not on file    Minutes per session: Not on file  . Stress: Not on file  Relationships  . Social connections:    Talks on phone: Not on file    Gets together: Not on file    Attends religious service: Not on file    Active member of club or organization: Not on file    Attends meetings of clubs or organizations: Not on file    Relationship status: Not on file  . Intimate partner violence:    Fear of current or ex partner: Not on file    Emotionally abused: Not on file    Physically abused: Not on file    Forced sexual activity: Not on file  Other Topics Concern  . Not on file  Social History Narrative  . Not on file    Review of Systems Per HPI.      Objective:   Physical Exam Vitals signs reviewed.  Constitutional:      Appearance: She is well-developed.  HENT:     Head: Normocephalic and atraumatic.     Right Ear: External ear normal.     Left Ear: External ear normal.  Eyes:     Conjunctiva/sclera: Conjunctivae normal.     Pupils: Pupils are equal, round, and reactive to light.  Neck:     Musculoskeletal: Normal range of motion and neck supple.     Thyroid: No thyromegaly.  Cardiovascular:     Rate and Rhythm: Normal rate and regular rhythm.     Heart sounds: Normal heart sounds. No murmur.  Pulmonary:     Effort: Pulmonary effort is normal. No respiratory distress.     Breath sounds: Normal breath sounds. No wheezing.  Abdominal:     General: Bowel sounds are normal.     Palpations: Abdomen is soft.     Tenderness: There is no abdominal tenderness.  Musculoskeletal: Normal range of motion.        General: No tenderness.  Lymphadenopathy:     Cervical: No cervical adenopathy.  Skin:    General: Skin is warm and dry.       Neurological:     Mental Status: She is alert and oriented to person, place, and time.  Psychiatric:        Attention and Perception: Attention normal.        Mood and Affect: Mood normal.        Speech: Speech normal.        Behavior: Behavior normal. Behavior is cooperative.        Thought Content: Thought content normal.    Vitals:   07/26/18 1507  BP: 120/70  Pulse: 77  Resp: 12  Temp: 98.7 F (37.1 C)  TempSrc: Oral  SpO2: 98%  Weight: 126 lb 6.4 oz (57.3 kg)  Height: 5' 7"  (1.702 m)          Assessment & Plan:   April Russo is a 28 y.o. female Annual physical exam - Plan: QuantiFERON-TB Gold Plus  --anticipatory guidance as below in AVS, screening labs above. Health maintenance items as above in HPI discussed/recommended as applicable.   -No concerning findings on exam/history.  QuantiFERON gold was obtained for tuberculosis screening for nursing school.  Situational  stress  -Mild, and has already planned to meet with EAP for recommendations.  Additionally handout given on stress and stress management with RTC precautions if persistent or worsening symptoms.  Hand dermatitis  -Minimal involvement at this time,  and has been controlled with triamcinolone topical as needed.  If more persistent or worsening areas, would increase strength of topical steroid.  No orders of the defined types were placed in this encounter.  Patient Instructions   Schedule dental visit when possible. Here is an option if needed.  [facility]  Address: [address], [place], [place] [address]  [email or web address]  Phone: [phone]  Meeting with EAP may help with stress management. See other info below. If increasing stress  - please let me know so we can discuss further.   Keeping You Healthy  Get These Tests 1. Blood Pressure- Have your blood pressure checked once a year by your health care provider.  Normal blood pressure is 120/80. 2. Weight- Have your body mass index (BMI) calculated to screen for obesity.  BMI is measure of body fat based on height and weight.  You can also calculate your own BMI at GravelBags.[email or web address]. 3. Cholesterol- Have your cholesterol checked every 5 years starting at age 53 then yearly starting at age 30. 61. Chlamydia, HIV, and other sexually transmitted diseases- Get screened every year until age 55, then within three months of each new sexual provider. 5. Pap Test - Every 1-5 years; discuss with your health care provider. 6. Mammogram- Every 1-2 years starting at age 64--50  Take these medicines  Calcium with Vitamin D-Your body needs 1200 mg of Calcium each day and [phone] IU of Vitamin D daily.  Your body can only absorb 500 mg of Calcium at a time so Calcium must be taken in 2 or 3 divided doses throughout the day.  Multivitamin with folic acid- Once daily if it is possible for you to become pregnant.  Get these  Immunizations  Gardasil-Series of three doses; prevents HPV related illness such as genital warts and cervical cancer.  Menactra-Single dose; prevents meningitis.  Tetanus shot- Every 10 years.  Flu shot-Every year.  Take these steps 1. Do not smoke-Your healthcare provider can help you quit.  For tips on how to quit go to www.smokefree.gov or call 1-800 QUITNOW. 2. Be physically active- Exercise 5 days a week for at least 30 minutes.  If you are not already physically active, start slow and gradually work up to 30 minutes of moderate physical activity.  Examples of moderate activity include walking briskly, dancing, swimming, bicycling, etc. 3. Breast Cancer- A self breast exam every month is important for early detection of breast cancer.  For more information and instruction on self breast exams, ask your healthcare provider or https://www.patel.info/. 4. Eat a healthy diet- Eat a variety of healthy foods such as fruits, vegetables, whole grains, low fat milk, low fat cheeses, yogurt, lean meats, poultry and fish, beans, nuts, tofu, etc.  For more information go to www. Thenutritionsource.org 5. Drink alcohol in moderation- Limit alcohol intake to one drink or less per day. Never drink and drive. 6. Depression- Your emotional health is as important as your physical health.  If you're feeling down or losing interest in things you normally enjoy please talk to your healthcare provider about being screened for depression. 7. Dental visit- Brush and floss your teeth twice daily; visit your dentist twice a year. 8. Eye doctor- Get an eye exam at least every 2 years. 9. Helmet use- Always wear a helmet when riding a bicycle, motorcycle, rollerblading or skateboarding. 25. Safe sex- If you may be exposed to sexually transmitted infections, use a condom. 11. Seat belts- Seat belts can save your live;  always wear one. 12. Smoke/Carbon Monoxide detectors- These detectors need to  be installed on the appropriate level of your home. Replace batteries at least once a year. 13. Skin cancer- When out in the sun please cover up and use sunscreen 15 SPF or higher. 14. Violence- If anyone is threatening or hurting you, please tell your healthcare provider.         Stress Stress is a normal reaction to life events. Stress is what you feel when life demands more than you are used to, or more than you think you can handle. Some stress can be useful, such as studying for a test or meeting a deadline at work. Stress that occurs too often or for too long can cause problems. It can affect your emotional health and interfere with relationships and normal daily activities. Too much stress can weaken your body's defense system (immune system) and increase your risk for physical illness. If you already have a medical problem, stress can make it worse. What are the causes? All sorts of life events can cause stress. An event that causes stress for one person may not be stressful for another person. Major life events, whether positive or negative, commonly cause stress. Examples include:  Losing a job or starting a new job.  Losing a loved one.  Moving to a new town or home.  Getting married or divorced.  Having a baby.  Injury or illness. Less obvious life events can also cause stress, especially if they occur day after day or in combination with each other. Examples include:  Working long hours.  Driving in traffic.  Caring for children.  Being in debt.  Being in a difficult relationship. What are the signs or symptoms? Stress can cause emotional symptoms, including:  Anxiety. This is feeling worried, afraid, on edge, overwhelmed, or out of control.  Anger, including irritation or impatience.  Depression. This is feeling sad, down, helpless, or guilty.  Trouble focusing, remembering, or making decisions. Stress can cause physical symptoms, including:  Aches and  pains. These may affect your head, neck, back, stomach, or other areas of your body.  Tight muscles or a clenched jaw.  Low energy.  Trouble sleeping. Stress can cause unhealthy behaviors, including:  Eating to feel better (overeating) or skipping meals.  Working too much or putting off tasks.  Smoking, drinking alcohol, or using drugs to feel better. How is this diagnosed? Stress is diagnosed through an assessment by your health care provider. He or she may diagnose this condition based on:  Your symptoms and any stressful life events.  Your medical history.  Tests to rule out other causes of your symptoms. Depending on your condition, your health care provider may refer you to a specialist for further evaluation. How is this treated?  Stress management techniques are the recommended treatment for stress. Medicine is not typically recommended for the treatment of stress. Techniques to reduce your reaction to stressful life events include:  Stress identification. Monitor yourself for symptoms of stress and identify what causes stress for you. These skills may help you to avoid or prepare for stressful events.  Time management. Set your priorities, keep a calendar of events, and learn to say "no." Taking these actions can help you avoid making too many commitments. Techniques for coping with stress include:  Rethinking the problem. Try to think realistically about stressful events rather than ignoring them or overreacting. Try to find the positives in a stressful situation rather than focusing on the  negatives.  Exercise. Physical exercise can release both physical and emotional tension. The key is to find a form of exercise that you enjoy and do it regularly.  Relaxation techniques. These relax the body and mind. The key is to find one or more that you enjoy and use the technique(s) regularly. Examples include: ? Meditation, deep breathing, or progressive relaxation techniques.  ? Yoga or tai chi. ? Biofeedback, mindfulness techniques, or journaling. ? Listening to music, being out in nature, or participating in other hobbies.  Practicing a healthy lifestyle. Eat a balanced diet, drink plenty of water, limit or avoid caffeine, and get plenty of sleep.  Having a strong support network. Spend time with family, friends, or other people you enjoy being around. Express your feelings and talk things over with someone you trust. Counseling or talk therapy with a mental health professional may be helpful if you are having trouble managing stress on your own. Follow these instructions at home: Lifestyle   Avoid drugs.  Do not use any products that contain nicotine or tobacco, such as cigarettes and e-cigarettes. If you need help quitting, ask your health care provider.  Limit alcohol intake to no more than 1 drink a day for nonpregnant women and 2 drinks a day for men. One drink equals 12 oz of beer, 5 oz of wine, or 1 oz of hard liquor.  Do not use alcohol or drugs to relax.  Eat a balanced diet that includes fresh fruits and vegetables, whole grains, lean meats, fish, eggs, and beans, and low-fat dairy. Avoid processed foods and foods high in added fat, sugar, and salt.  Exercise at least 30 minutes on 5 or more days each week.  Get 7-8 hours of sleep each night. General instructions   Practice stress management techniques as discussed with your health care provider.  Drink enough fluid to keep your urine clear or pale yellow.  Take over-the-counter and prescription medicines only as told by your health care provider.  Keep all follow-up visits as told by your health care provider. This is important. Contact a health care provider if:  Your symptoms get worse.  You have new symptoms.  You feel overwhelmed by your problems and can no longer manage them on your own. Get help right away if:  You have thoughts of hurting yourself or others. If you ever  feel like you may hurt yourself or others, or have thoughts about taking your own life, get help right away. You can go to your nearest emergency department or call:  Your local emergency services (911 in the [place]  A suicide crisis helpline, such as the [facility] at 985 486 4417. This is open 24 hours a day. Summary  Stress is a normal reaction to life events. It can cause problems if it happens too often or for too long.  Practicing stress management techniques is the best way to treat stress.  Counseling or talk therapy with a mental health professional may be helpful if you are having trouble managing stress on your own. This information is not intended to replace advice given to you by your health care provider. Make sure you discuss any questions you have with your health care provider. Document Released: 07/28/2000 Document Revised: 03/24/2016 Document Reviewed: 03/24/2016 Elsevier Interactive Patient Education  [employer].    If you have lab work done today you will be contacted with your lab results within the next 2 weeks.  If you have not heard from  [place] then please contact us. The fastest way to get your results is to register for My Chart.   IF you received an x-ray today, you will receive an invoice from [facility]. Please contact [facility] at [phone] with questions or concerns regarding your invoice.   IF you received labwork today, you will receive an invoice from [facility]. Please contact [facility] at [phone] with questions or concerns regarding your invoice.   Our billing staff will not be able to assist you with questions regarding bills from these companies.  You will be contacted with the lab results as soon as they are available. The fastest way to get your results is to activate your My Chart account. Instructions are located on the last page of this paperwork. If you have not heard from [place] regarding  the results in 2 weeks, please contact this office.       Signed,   [name], MD [facility].  07/27/18 2:47 PM

## 2018-07-26 NOTE — Patient Instructions (Addendum)
Schedule dental visit when possible. Here is an option if needed.  [facility]  Address: [address], [place], [place] [address]  [email or web address]  Phone: [phone]  Meeting with EAP may help with stress management. See other info below. If increasing stress  - please let me know so we can discuss further.   Keeping You Healthy  Get These Tests 1. Blood Pressure- Have your blood pressure checked once a year by your health care provider.  Normal blood pressure is 120/80. 2. Weight- Have your body mass index (BMI) calculated to screen for obesity.  BMI is measure of body fat based on height and weight.  You can also calculate your own BMI at GravelBags.it. 3. Cholesterol- Have your cholesterol checked every 5 years starting at age 30 then yearly starting at age 56. 67. Chlamydia, HIV, and other sexually transmitted diseases- Get screened every year until age 67, then within three months of each new sexual provider. 5. Pap Test - Every 1-5 years; discuss with your health care provider. 6. Mammogram- Every 1-2 years starting at age 72--50  Take these medicines  Calcium with Vitamin D-Your body needs 1200 mg of Calcium each day and [phone] IU of Vitamin D daily.  Your body can only absorb 500 mg of Calcium at a time so Calcium must be taken in 2 or 3 divided doses throughout the day.  Multivitamin with folic acid- Once daily if it is possible for you to become pregnant.  Get these Immunizations  Gardasil-Series of three doses; prevents HPV related illness such as genital warts and cervical cancer.  Menactra-Single dose; prevents meningitis.  Tetanus shot- Every 10 years.  Flu shot-Every year.  Take these steps 1. Do not smoke-Your healthcare provider can help you quit.  For tips on how to quit go to www.smokefree.gov or call 1-800 QUITNOW. 2. Be physically active- Exercise 5 days a week for at least 30 minutes.  If you are not already physically active,  start slow and gradually work up to 30 minutes of moderate physical activity.  Examples of moderate activity include walking briskly, dancing, swimming, bicycling, etc. 3. Breast Cancer- A self breast exam every month is important for early detection of breast cancer.  For more information and instruction on self breast exams, ask your healthcare provider or https://www.patel.info/. 4. Eat a healthy diet- Eat a variety of healthy foods such as fruits, vegetables, whole grains, low fat milk, low fat cheeses, yogurt, lean meats, poultry and fish, beans, nuts, tofu, etc.  For more information go to www. Thenutritionsource.org 5. Drink alcohol in moderation- Limit alcohol intake to one drink or less per day. Never drink and drive. 6. Depression- Your emotional health is as important as your physical health.  If you're feeling down or losing interest in things you normally enjoy please talk to your healthcare provider about being screened for depression. 7. Dental visit- Brush and floss your teeth twice daily; visit your dentist twice a year. 8. Eye doctor- Get an eye exam at least every 2 years. 9. Helmet use- Always wear a helmet when riding a bicycle, motorcycle, rollerblading or skateboarding. 59. Safe sex- If you may be exposed to sexually transmitted infections, use a condom. 11. Seat belts- Seat belts can save your live; always wear one. 12. Smoke/Carbon Monoxide detectors- These detectors need to be installed on the appropriate level of your home. Replace batteries at least once a year. 13. Skin cancer- When out in the sun please cover up and  use sunscreen 15 SPF or higher. 14. Violence- If anyone is threatening or hurting you, please tell your healthcare provider.         Stress Stress is a normal reaction to life events. Stress is what you feel when life demands more than you are used to, or more than you think you can handle. Some stress can be useful, such as  studying for a test or meeting a deadline at work. Stress that occurs too often or for too long can cause problems. It can affect your emotional health and interfere with relationships and normal daily activities. Too much stress can weaken your body's defense system (immune system) and increase your risk for physical illness. If you already have a medical problem, stress can make it worse. What are the causes? All sorts of life events can cause stress. An event that causes stress for one person may not be stressful for another person. Major life events, whether positive or negative, commonly cause stress. Examples include:  Losing a job or starting a new job.  Losing a loved one.  Moving to a new town or home.  Getting married or divorced.  Having a baby.  Injury or illness. Less obvious life events can also cause stress, especially if they occur day after day or in combination with each other. Examples include:  Working long hours.  Driving in traffic.  Caring for children.  Being in debt.  Being in a difficult relationship. What are the signs or symptoms? Stress can cause emotional symptoms, including:  Anxiety. This is feeling worried, afraid, on edge, overwhelmed, or out of control.  Anger, including irritation or impatience.  Depression. This is feeling sad, down, helpless, or guilty.  Trouble focusing, remembering, or making decisions. Stress can cause physical symptoms, including:  Aches and pains. These may affect your head, neck, back, stomach, or other areas of your body.  Tight muscles or a clenched jaw.  Low energy.  Trouble sleeping. Stress can cause unhealthy behaviors, including:  Eating to feel better (overeating) or skipping meals.  Working too much or putting off tasks.  Smoking, drinking alcohol, or using drugs to feel better. How is this diagnosed? Stress is diagnosed through an assessment by your health care provider. He or she may diagnose  this condition based on:  Your symptoms and any stressful life events.  Your medical history.  Tests to rule out other causes of your symptoms. Depending on your condition, your health care provider may refer you to a specialist for further evaluation. How is this treated?  Stress management techniques are the recommended treatment for stress. Medicine is not typically recommended for the treatment of stress. Techniques to reduce your reaction to stressful life events include:  Stress identification. Monitor yourself for symptoms of stress and identify what causes stress for you. These skills may help you to avoid or prepare for stressful events.  Time management. Set your priorities, keep a calendar of events, and learn to say "no." Taking these actions can help you avoid making too many commitments. Techniques for coping with stress include:  Rethinking the problem. Try to think realistically about stressful events rather than ignoring them or overreacting. Try to find the positives in a stressful situation rather than focusing on the negatives.  Exercise. Physical exercise can release both physical and emotional tension. The key is to find a form of exercise that you enjoy and do it regularly.  Relaxation techniques. These relax the body and mind. The key  is to find one or more that you enjoy and use the technique(s) regularly. Examples include: ? Meditation, deep breathing, or progressive relaxation techniques. ? Yoga or tai chi. ? Biofeedback, mindfulness techniques, or journaling. ? Listening to music, being out in nature, or participating in other hobbies.  Practicing a healthy lifestyle. Eat a balanced diet, drink plenty of water, limit or avoid caffeine, and get plenty of sleep.  Having a strong support network. Spend time with family, friends, or other people you enjoy being around. Express your feelings and talk things over with someone you trust. Counseling or talk therapy  with a mental health professional may be helpful if you are having trouble managing stress on your own. Follow these instructions at home: Lifestyle   Avoid drugs.  Do not use any products that contain nicotine or tobacco, such as cigarettes and e-cigarettes. If you need help quitting, ask your health care provider.  Limit alcohol intake to no more than 1 drink a day for nonpregnant women and 2 drinks a day for men. One drink equals 12 oz of beer, 5 oz of wine, or 1 oz of hard liquor.  Do not use alcohol or drugs to relax.  Eat a balanced diet that includes fresh fruits and vegetables, whole grains, lean meats, fish, eggs, and beans, and low-fat dairy. Avoid processed foods and foods high in added fat, sugar, and salt.  Exercise at least 30 minutes on 5 or more days each week.  Get 7-8 hours of sleep each night. General instructions   Practice stress management techniques as discussed with your health care provider.  Drink enough fluid to keep your urine clear or pale yellow.  Take over-the-counter and prescription medicines only as told by your health care provider.  Keep all follow-up visits as told by your health care provider. This is important. Contact a health care provider if:  Your symptoms get worse.  You have new symptoms.  You feel overwhelmed by your problems and can no longer manage them on your own. Get help right away if:  You have thoughts of hurting yourself or others. If you ever feel like you may hurt yourself or others, or have thoughts about taking your own life, get help right away. You can go to your nearest emergency department or call:  Your local emergency services (911 in the [place]  A suicide crisis helpline, such as the [facility] at 604 763 6500. This is open 24 hours a day. Summary  Stress is a normal reaction to life events. It can cause problems if it happens too often or for too long.  Practicing stress  management techniques is the best way to treat stress.  Counseling or talk therapy with a mental health professional may be helpful if you are having trouble managing stress on your own. This information is not intended to replace advice given to you by your health care provider. Make sure you discuss any questions you have with your health care provider. Document Released: 07/28/2000 Document Revised: 03/24/2016 Document Reviewed: 03/24/2016 Elsevier Interactive Patient Education  [employer].    If you have lab work done today you will be contacted with your lab results within the next 2 weeks.  If you have not heard from [place] then please contact us. The fastest way to get your results is to register for My Chart.   IF you received an x-ray today, you will receive an invoice from [facility]. Please contact [facility] at  [phone] with questions or concerns regarding your invoice.   IF you received labwork today, you will receive an invoice from [facility]. Please contact [facility] at [phone] with questions or concerns regarding your invoice.   Our billing staff will not be able to assist you with questions regarding bills from these companies.  You will be contacted with the lab results as soon as they are available. The fastest way to get your results is to activate your My Chart account. Instructions are located on the last page of this paperwork. If you have not heard from [place] regarding the results in 2 weeks, please contact this office.

## 2018-07-30 LAB — QUANTIFERON-TB GOLD PLUS
QuantiFERON Mitogen Value: >10 IU/mL
QuantiFERON Nil Value: 0.01 IU/mL
QuantiFERON TB1 Ag Value: 0.01 IU/mL
QuantiFERON TB2 Ag Value: 0.01 IU/mL
QuantiFERON-TB Gold Plus: NEGATIVE

## 2019-05-28 ENCOUNTER — Encounter: Payer: Self-pay | Admitting: Family Medicine

## 2019-05-28 DIAGNOSIS — L309 Dermatitis, unspecified: Secondary | ICD-10-CM

## 2019-05-30 MED ORDER — TRIAMCINOLONE ACETONIDE 0.1 % EX CREA: 1.0000 "application " | TOPICAL_CREAM | Freq: Two times a day (BID) | CUTANEOUS | 1 refills | Status: DC

## 2019-05-30 NOTE — Telephone Encounter (Signed)
This was discussed at exam in 07/26/18.  Refill ordered.

## 2019-05-30 NOTE — Telephone Encounter (Signed)
Pt requesting refill for Triamcinolone Acetonide 0.1%  Last filled 03/28/2018 Last OV 07/26/2018  Does pt need an appointment for reevaluation?

## 2019-06-26 ENCOUNTER — Other Ambulatory Visit: Payer: Self-pay | Admitting: Emergency Medicine

## 2019-06-26 DIAGNOSIS — Z111 Encounter for screening for respiratory tuberculosis: Secondary | ICD-10-CM

## 2019-06-28 ENCOUNTER — Other Ambulatory Visit: Payer: Self-pay

## 2019-06-28 ENCOUNTER — Ambulatory Visit (INDEPENDENT_AMBULATORY_CARE_PROVIDER_SITE_OTHER): Payer: 59 | Admitting: Family Medicine

## 2019-06-28 DIAGNOSIS — Z111 Encounter for screening for respiratory tuberculosis: Secondary | ICD-10-CM

## 2019-06-28 NOTE — Addendum Note (Signed)
Addended by: Leim Fabry on: 06/28/2019 10:54 AM   Modules accepted: Orders

## 2019-06-30 LAB — QUANTIFERON-TB GOLD PLUS
QuantiFERON Mitogen Value: >10 IU/mL
QuantiFERON Nil Value: 0 IU/mL
QuantiFERON TB1 Ag Value: 0 IU/mL
QuantiFERON TB2 Ag Value: 0 IU/mL
QuantiFERON-TB Gold Plus: NEGATIVE

## 2019-10-10 ENCOUNTER — Other Ambulatory Visit: Payer: Self-pay

## 2019-10-10 ENCOUNTER — Other Ambulatory Visit: Payer: 59

## 2019-10-10 DIAGNOSIS — Z20822 Contact with and (suspected) exposure to covid-19: Secondary | ICD-10-CM

## 2019-10-12 LAB — SARS-COV-2, NAA 2 DAY TAT

## 2019-10-12 LAB — SPECIMEN STATUS REPORT

## 2019-10-12 LAB — NOVEL CORONAVIRUS, NAA: SARS-CoV-2, NAA: NOT DETECTED

## 2019-10-25 ENCOUNTER — Ambulatory Visit: Payer: 59 | Admitting: Family Medicine

## 2020-01-21 ENCOUNTER — Ambulatory Visit (INDEPENDENT_AMBULATORY_CARE_PROVIDER_SITE_OTHER): Payer: 59 | Admitting: Family Medicine

## 2020-01-21 ENCOUNTER — Other Ambulatory Visit: Payer: Self-pay

## 2020-01-21 ENCOUNTER — Ambulatory Visit
Admission: RE | Admit: 2020-01-21 | Discharge: 2020-01-21 | Disposition: A | Discharge status: Home / Self Care | Payer: 59 | Source: Ambulatory Visit | Attending: Family Medicine | Admitting: Family Medicine

## 2020-01-21 ENCOUNTER — Encounter: Payer: Self-pay | Admitting: Family Medicine

## 2020-01-21 VITALS — BP 111/73 | HR 92 | Temp 98.0°F | Ht 67.0 in | Wt 129.0 lb

## 2020-01-21 DIAGNOSIS — M25531 Pain in right wrist: Secondary | ICD-10-CM

## 2020-01-21 NOTE — Patient Instructions (Addendum)
     If you have lab work done today you will be contacted with your lab results within the next 2 weeks.  If you have not heard from [place] then please contact us. The fastest way to get your results is to register for My Chart.   IF you received an x-ray today, you will receive an invoice from [facility]. Please contact [facility] at [phone] with questions or concerns regarding your invoice.   IF you received labwork today, you will receive an invoice from [facility]. Please contact [facility] at [phone] with questions or concerns regarding your invoice.   Our billing staff will not be able to assist you with questions regarding bills from these companies.  You will be contacted with the lab results as soon as they are available. The fastest way to get your results is to activate your My Chart account. Instructions are located on the last page of this paperwork. If you have not heard from [place] regarding the results in 2 weeks, please contact this office.    I will check xray, then likely will refer you to hand specialist. Avoid direct pressure to area for now, as well as forceful extension.

## 2020-01-21 NOTE — Progress Notes (Signed)
Subjective:  Patient ID: April Russo, female    DOB: 02-Dec-1990  Age: 29 y.o. MRN: [id number]  CC:  Chief Complaint  Patient presents with  . Wrist Pain    Pt reports Chronic dorsal wrist pain. pain has been on going for about a year or longer per pt. Pt reports no known injury that coulf have caused the pain. Pt reports that she has a cyst back in high school around where the pain is. pt states she accidently slamed the cyst on the side of a fridge and the cyst dissapeared. pt is unsure if this is related.    HPI April Russo presents for   R Wrist pain: Pain off and on since high school. Had cyst close to area in past, self ruptured after hitting worst on fridge.  R hand dominant.  Sore to apply pressure.  Hurts to apply palmar pressure, push ups. Uses handles to help. Pain with wrist forceful or hyperextension.  No swelling, redness, bruising.  Soccer in HS, goalie, but no known injury.  Some soreness if prolonged computer work. Mouse work.  More sore past month - more days with aches.  No recent change in activities.  Tx: rare ibuprofen - helps a little - once a month.       History Patient Active Problem List   Diagnosis Date Noted  . Influenza 02/03/2018  . Flu-like symptoms 02/03/2018  . Sore throat 02/03/2018  . Sinus congestion 02/03/2018   Past Medical History:  Diagnosis Date  . Depression    Past Surgical History:  Procedure Laterality Date  . wisdom tooth removal Bilateral    No Known Allergies Prior to Admission medications   Medication Sig Start Date End Date Taking? Authorizing Provider  cetirizine (ZYRTEC) 10 MG tablet Take 10 mg by mouth as needed for allergies.   Yes [provider]  norgestimate-ethinyl estradiol ([place] 28) 0.25-35 MG-MCG tablet  07/19/19  Yes [provider]  triamcinolone cream (KENALOG) 0.1 % Apply 1 application topically 2 (two) times daily. As needed 05/30/19  Yes [name], MD   Social History     Socioeconomic History  . Marital status: Married    Spouse name: Not on file  . Number of children: Not on file  . Years of education: Not on file  . Highest education level: Not on file  Occupational History  . Not on file  Tobacco Use  . Smoking status: Never Smoker  . Smokeless tobacco: Never Used  Substance and Sexual Activity  . Alcohol use: Yes    Comment: [place]   . Drug use: Never  . Sexual activity: Yes  Other Topics Concern  . Not on file  Social History Narrative  . Not on file   Social Determinants of Health   Financial Resource Strain:   . Difficulty of Paying Living Expenses: Not on file  Food Insecurity:   . Worried About Charity fundraiser in the Last Year: Not on file  . Ran Out of Food in the Last Year: Not on file  Transportation Needs:   . Lack of Transportation (Medical): Not on file  . Lack of Transportation (Non-Medical): Not on file  Physical Activity:   . Days of Exercise per Week: Not on file  . Minutes of Exercise per Session: Not on file  Stress:   . Feeling of Stress : Not on file  Social Connections:   . Frequency of Communication with Friends and Family: Not on  file  . Frequency of Social Gatherings with Friends and Family: Not on file  . Attends Religious Services: Not on file  . Active Member of Clubs or Organizations: Not on file  . Attends Archivist Meetings: Not on file  . Marital Status: Not on file  Intimate Partner Violence:   . Fear of Current or Ex-Partner: Not on file  . Emotionally Abused: Not on file  . Physically Abused: Not on file  . Sexually Abused: Not on file    Review of Systems   Objective:   Vitals:   01/21/20 1514  BP: 111/73  Pulse: 92  Temp: 98 F (36.7 C)  TempSrc: Temporal  SpO2: 98%  Weight: 129 lb (58.5 kg)  Height: 5\' 7"  (1.702 m)     Physical Exam Constitutional:      General: She is not in acute distress.    Appearance: She is well-developed.  HENT:     Head:  Normocephalic and atraumatic.  Cardiovascular:     Rate and Rhythm: Normal rate.  Pulmonary:     Effort: Pulmonary effort is normal.  Musculoskeletal:     Comments: Right wrist Skin intact, no erythema, no soft tissue swelling.  Neurovascular intact distally with cap refill less than 1 second.  Negative Tinel's, normal thenar bulk. Full range of motion, but discomfort at terminal extension, radial deviation and terminal flexion. Fingers, metacarpals nontender. Scaphoid nontender.  Distal radial ulnar joint  nontender.  Forearm nontender Pain with palpation over the lunate, no crepitus.   Neurological:     Mental Status: She is alert and oriented to person, place, and time.     Assessment & Plan:  April Russo is a 29 y.o. female . Right wrist pain - Plan: DG Wrist Complete Right  -Longstanding issue, appears to be primarily focused at the lunate.  Differential includes [place]/avascular necrosis with previous sports in high school at time of onset but no known specific injuries.  Scapholunate dissociation, or other contributors to dorsal intercalated segmental instability also in differential based on location.  No appreciable click on exam.  Check initial x-ray, then likely hand surgical eval.  Continue activity modification at this time, avoid direct pressure/repetitive pressure to the area.  No orders of the defined types were placed in this encounter.  Patient Instructions       If you have lab work done today you will be contacted with your lab results within the next 2 weeks.  If you have not heard from [place] then please contact us. The fastest way to get your results is to register for My Chart.   IF you received an x-ray today, you will receive an invoice from [facility]. Please contact [facility] at [phone] with questions or concerns regarding your invoice.   IF you received labwork today, you will receive an invoice from [place] [facility]. Please contact  [facility] at [phone] with questions or concerns regarding your invoice.   Our billing staff will not be able to assist you with questions regarding bills from these companies.  You will be contacted with the lab results as soon as they are available. The fastest way to get your results is to activate your My Chart account. Instructions are located on the last page of this paperwork. If you have not heard from [place] regarding the results in 2 weeks, please contact this office.    I will check xray, then likely will refer you to hand specialist. Avoid direct pressure to area  for now, as well as forceful extension.      Signed, [name], MD [facility]

## 2020-01-22 ENCOUNTER — Encounter: Payer: Self-pay | Admitting: Family Medicine

## 2020-01-30 ENCOUNTER — Encounter: Payer: Self-pay | Admitting: Family Medicine

## 2020-05-01 ENCOUNTER — Ambulatory Visit: Payer: 59 | Admitting: Family Medicine

## 2020-05-07 ENCOUNTER — Ambulatory Visit: Payer: 59 | Admitting: Family Medicine

## 2020-08-25 LAB — HM PAP SMEAR: HM Pap smear: NORMAL

## 2020-08-27 ENCOUNTER — Telehealth: Payer: Self-pay | Admitting: Genetic Counselor

## 2020-08-27 NOTE — Telephone Encounter (Signed)
Received a genetic counseling referral from Dr. Melba Coon for fhx of breast cancer. MS. April Russo has been scheduled to see Raquel Sarna on 7/26 at 11am. Pt aware to arrive 15 minutes early.

## 2020-09-09 ENCOUNTER — Inpatient Hospital Stay: Payer: 59

## 2020-09-09 ENCOUNTER — Inpatient Hospital Stay: Payer: 59 | Attending: Genetic Counselor | Admitting: Genetic Counselor

## 2020-09-09 ENCOUNTER — Other Ambulatory Visit: Payer: Self-pay

## 2020-09-09 DIAGNOSIS — Z807 Family history of other malignant neoplasms of lymphoid, hematopoietic and related tissues: Secondary | ICD-10-CM

## 2020-09-09 DIAGNOSIS — Z803 Family history of malignant neoplasm of breast: Secondary | ICD-10-CM

## 2020-09-09 DIAGNOSIS — Z8481 Family history of carrier of genetic disease: Secondary | ICD-10-CM | POA: Diagnosis not present

## 2020-09-09 DIAGNOSIS — Z8052 Family history of malignant neoplasm of bladder: Secondary | ICD-10-CM

## 2020-09-10 ENCOUNTER — Encounter: Payer: Self-pay | Admitting: Genetic Counselor

## 2020-09-10 DIAGNOSIS — Z8052 Family history of malignant neoplasm of bladder: Secondary | ICD-10-CM | POA: Insufficient documentation

## 2020-09-10 DIAGNOSIS — Z803 Family history of malignant neoplasm of breast: Secondary | ICD-10-CM | POA: Insufficient documentation

## 2020-09-10 DIAGNOSIS — Z8481 Family history of carrier of genetic disease: Secondary | ICD-10-CM | POA: Insufficient documentation

## 2020-09-10 DIAGNOSIS — Z807 Family history of other malignant neoplasms of lymphoid, hematopoietic and related tissues: Secondary | ICD-10-CM | POA: Insufficient documentation

## 2020-09-10 NOTE — Progress Notes (Signed)
REFERRING PROVIDER: Janyth Contes, MD [address] [place],  [place] [address]  PRIMARY PROVIDER:  [name], MD  PRIMARY REASON FOR VISIT:  1. Family history of gene mutation   2. Family history of breast cancer   3. Family history of bladder cancer   4. Family history of multiple myeloma      HISTORY OF PRESENT ILLNESS:   April Russo, a 30 y.o. female, was seen for a [facility] cancer genetics consultation at the request of Dr. Sandford Craze due to a family history of a known BRCA1 mutation and cancer.  April Russo presents to clinic today to discuss the possibility of a hereditary predisposition to cancer, genetic testing, and to further clarify her future cancer risks, as well as potential cancer risks for family members.   April Russo does not have a personal history of cancer.    RISK FACTORS:  Menarche was at age 66.  No live births.  OCP use for approximately 2 years.  Ovaries intact: yes.  Hysterectomy: yes.  Menopausal status: premenopausal.  HRT use: 0 years. Colonoscopy: no; not examined. Mammogram within the last year: no. Number of breast biopsies: 0. Up to date with pelvic exams: yes. Any excessive radiation exposure in the past: no.  Past Medical History:  Diagnosis Date   Depression    Family history of bladder cancer    Family history of breast cancer    Family history of gene mutation    BRCA1 c.81-1G>A (IVS2-1G>A)   Family history of multiple myeloma     Past Surgical History:  Procedure Laterality Date   wisdom tooth removal Bilateral     Social History   Socioeconomic History   Marital status: Married    Spouse name: Not on file   Number of children: Not on file   Years of education: Not on file   Highest education level: Not on file  Occupational History   Not on file  Tobacco Use   Smoking status: Never   Smokeless tobacco: Never  Substance and Sexual Activity   Alcohol use: Yes    Comment: [place]    Drug  use: Never   Sexual activity: Yes  Other Topics Concern   Not on file  Social History Narrative   Not on file   Social Determinants of Health   Financial Resource Strain: Not on file  Food Insecurity: Not on file  Transportation Needs: Not on file  Physical Activity: Not on file  Stress: Not on file  Social Connections: Not on file     FAMILY HISTORY:  We obtained a detailed, 4-generation family history.  Significant diagnoses are listed below: Family History  Problem Relation Age of Onset   Stroke Sister 57   Breast cancer Maternal Aunt 23       both breasts, b/l mastectomies, ovaries removed   BRCA 1/2 Maternal Aunt        BRCA1 c.81-1G>A (IVS2-1G>A)   Other Maternal Uncle        BRCA1 negative   Emphysema Maternal Grandmother    Multiple myeloma Paternal Grandmother 61   Heart disease Paternal Grandfather    Bladder Cancer Paternal Grandfather 83   Breast cancer Other        maternal great-aunt (MGF's sister)   Cancer Other        multiple of her mother's cousins   April Russo does not have children. She has two brothers (ages 80 and 28) and three sisters (ages  24-39). None of these relatives have had cancer.  April Russo mother is alive at age 93 without cancer, and she has decided to not have testing for the familial BRCA1 mutation. There is one maternal aunt and seven maternal uncles. Her aunt had bilateral breast cancer in her 75s or 23s and had genetic testing that revealed a mutation in the BRCA1 gene called c.81-1G>A. She had bilateral mastectomies and had her ovaries removed. One uncle had negative genetic testing for the BRCA1 mutation. There is no known cancer among maternal cousins. April Russo maternal grandmother died at age 68 without cancer. Her maternal grandfather died at age 65 without cancer. A maternal great-aunt (MGF's sister) had breast cancer, and multiple maternal first cousins once removed (MGF's nieces) have had cancer.  April Russo father is alive  at age 29 without cancer. There is one paternal aunt and one paternal uncle. There is no known cancer among paternal aunts/uncles or paternal cousins. April Russo paternal grandmother died at age 76 with multiple myeloma (diagnosed age 1). Her paternal grandfather died at age 27 with bladder cancer (diagnosed age 72).   April Russo is aware of previous family history of genetic testing for hereditary cancer risks. Patient's maternal ancestors are of [place] and otherwise unknown descent, and paternal ancestors are of [place], [place], and [place] descent. There is no reported Ashkenazi Jewish ancestry. There is no known consanguinity.  GENETIC COUNSELING ASSESSMENT: April Russo is a 30 y.o. female with a family history of of a known BRCA1 mutation in a maternal aunt. We, therefore, discussed and recommended the following at today's visit.   DISCUSSION:  We discussed that April Russo has a 25% (1 in 4) chance to also have the BRCA1 variant that was discovered in her maternal aunt. We reviewed the cancer risks that are associated with BRCA1 mutations, including an increased risk of female breast, ovariran, pancreatic, female breast, and prostate cancers. Studies show that women with a BRCA1 mutation can have a 40-87% lifetime risk to develop breast cancer, a 40-60% lifetime risk to develop a secondary breast cancer, and up to a 36-53% risk to develop ovarian cancer. Men can have a 1-2% lifetime risk to develop female breast cancer and an increased risk for prostate cancer. Both men and women can also have a 2-3% increased risk for pancreatic cancer. Individuals with a BRCA1 mutation may opt for the following increased cancer screening/risk-reduction options for associated cancer risks per the NCCN guidelines.  The following is recommended for women who carry a BRCA1 mutation: Breast awareness starting at the age of 27 years; women should report any changes to their breasts to their health care provider. Periodic breast  self-exams may facilitate breast self-awareness. Clinical breast exams every 6-12 months, starting at age 42 years Annual breast MRI and annual mammograms starting at age 36 years and 30 years, respectively, or individualized based on age of earliest onset of breast cancer in the family. Consideration of risk reducing mastectomy, which reduces the risk of breast cancer by greater than 90%. Consideration of risk reducing salpingo-oophorectomy (RRSO), ideally between the ages of 25-40, or individualized based on completion of child-bearing years or age of earliest onset of ovarian cancer in the family. RRSO reduces the risk of ovarian cancer by greater than 90% and can reduce the risk of breast cancer by 50% if performed prior to menopause. Women who have undergone risk reducing RRSO have a small risk of peritoneal carcinoma and can consider annual CA-125 surveillance. For women who  still have their ovaries, transvaginal ultrasound and CA-125 testing every 6 months starting at age 69 years, or 5-10 years prior to the earliest age of onset of ovarian cancer in the family can be considered. Studies have not demonstrated that ovarian cancer screening is effective in detecting early ovarian cancer. Consideration of chemoprevention options such as oral contraceptives, Tamoxifen and Raloxifene, if appropriate for an individual.  The following is recommended for both men and women who carry a BRCA1 mutation: Consideration of pancreatic cancer screening if there is a family history of pancreatic cancer Consider investigational imaging and screening studies, when available (e.g. novel imaging technologies, more frequent screening intervals) in the context of a clinical trial  The [employer]  (NCCN) recommends the following for men who carry a BRCA1 mutation: Breast self-exam training and education starting at age 92 years Clinical breast exam, every 12 months, starting at age 53  years Consider annual mammogram screening in men with gynecomastia starting at age 51, or 5 years before the earliest known female breast cancer in the family (whichever comes first) Prostate cancer screening starting at age 96 years (PSA and digital rectal exam)  We discussed that testing is beneficial for several reasons, including knowing about other cancer risks, identifying potential screening and risk-reduction options that may be appropriate, and to understand if other family members could be at risk for cancer and allow them to undergo genetic testing. We reviewed the characteristics, features and inheritance patterns of hereditary cancer syndromes. We also discussed genetic testing, including the appropriate family members to test, the process of testing, insurance coverage, genetic discrimination, and turn-around-time for results. We discussed the implications of a negative vs a positive result.  We recommended April Russo pursue genetic testing for the known familial variant in BRCA1 called c.81-1G>A through [facility]. Based on April Russo's family history of a known BRCA1 mutation and cancer, she meets medical criteria for genetic testing. Despite that she meets criteria, there may still be an out of pocket cost.  We discussed that some people do not want to undergo genetic testing due to fear of genetic discrimination. A federal law called the Genetic Information Non-Discrimination Act (GINA) of 2008 helps protect individuals against genetic discrimination based on their genetic test results. It impacts both health insurance and employment. With health insurance, it protects against increased premiums, being kicked off insurance or being forced to take a test in order to be insured. For employment it protects against hiring, firing and promoting decisions based on genetic test results. Health status due to a cancer diagnosis is not protected under GINA. Additionally, life, disability, and  long-term care insurance is not protected under GINA.    PLAN: April Russo did not wish to pursue genetic testing at today's visit as she would like some time to consider. We understand this decision and remain available to coordinate genetic testing at any time in the future.   Given that there is a known BRCA1 gene mutation in the family and we do not know whether April Russo has inherited it, we recommended that she follow cancer risk management as if she were BRCA1 positive. If she chooses to have genetic testing in the future and tests negative for the familial mutation, she should then return to population screening recommendations.  April Russo questions were answered to her satisfaction today. Our contact information was provided should additional questions or concerns arise. Thank you for the referral and allowing [place] to share in the care of  your patient.   Clint Guy, [place], [facility] Licensed, Certified Dispensing [email or web address] Phone: [phone]  The patient was seen for a total of 40 minutes in face-to-face genetic counseling. Patient was seen alone. This patient was discussed with Drs. Magrinat, Lindi Adie and/or Burr Medico who agrees with the above.    _______________________________________________________________________ For Office Staff:  Number of people involved in session: 1 Was an Intern/ student involved with case: no

## 2020-09-11 ENCOUNTER — Encounter: Payer: Self-pay | Admitting: Genetic Counselor

## 2020-09-17 ENCOUNTER — Telehealth: Payer: Self-pay | Admitting: Genetic Counselor

## 2020-09-17 NOTE — Telephone Encounter (Signed)
Ms. Reggio would like to move forward with genetic testing at this time. We reviewed genetic testing, including the process of testing, insurance coverage and turn-around-time for results. We discussed the implications of a negative, positive and/or variant of uncertain significant result. We recommended Ms. Turbyfill pursue genetic testing for the [facility] Common Hereditary Cancers + RNA panel. The Common Hereditary Cancers Panel offered by [facility] includes sequencing and/or deletion duplication testing of the following 47genes: APC*, ATM*, AXIN2*, BARD1*, BMPR1A*, BRCA1*, BRCA2*, BRIP1*, CDH1*, CDK4, CDKN2A (p14ARF), CDKN2A (p16INK4a), CHEK2*, CTNNA1*, DICER1*, EPCAM (Deletion/duplication testing only), GREM1 (promoter region deletion/duplication testing only), KIT, MEN1*, MLH1*, MSH2*, MSH3*, MSH6*, MUTYH*, NBN*, NF1*, NTHL1, PALB2*, PDGFRA, PMS2*, POLD1*, POLE*, PTEN*, RAD50*, RAD51C*, RAD51D*, SDHB*, SDHC*, SDHD*, SMAD4*, SMARCA4*, STK11*, TP53*, TSC1*, TSC2*, and VHL*.  The following genes were evaluated for sequence changes only: SDHA* and HOXB13 c.251G>A variant only. RNA analysis performed for * genes.   We scheduled a blood draw for 09/23/20 at 9:45am.  After considering the risks, benefits, and limitations, Ms. Eskridge provided informed consent to pursue genetic testing and the blood sample will be sent to [facility] for analysis of the Common Hereditary Cancers + RNA panel. Results should be available within approximately two-three weeks' time, at which point they will be disclosed by telephone to Ms. Loncar, as will any additional recommendations warranted by these results.

## 2020-09-18 ENCOUNTER — Encounter: Payer: Self-pay | Admitting: Family Medicine

## 2020-09-18 ENCOUNTER — Other Ambulatory Visit: Payer: Self-pay

## 2020-09-18 ENCOUNTER — Ambulatory Visit (INDEPENDENT_AMBULATORY_CARE_PROVIDER_SITE_OTHER): Payer: 59 | Admitting: Family Medicine

## 2020-09-18 VITALS — BP 116/77 | HR 66 | Temp 98.3°F | Resp 18 | Ht 67.0 in | Wt 125.6 lb

## 2020-09-18 DIAGNOSIS — Z130 Encounter for screening for diseases of the blood and blood-forming organs and certain disorders involving the immune mechanism: Secondary | ICD-10-CM

## 2020-09-18 DIAGNOSIS — Z131 Encounter for screening for diabetes mellitus: Secondary | ICD-10-CM | POA: Diagnosis not present

## 2020-09-18 DIAGNOSIS — Z13220 Encounter for screening for lipoid disorders: Secondary | ICD-10-CM | POA: Diagnosis not present

## 2020-09-18 DIAGNOSIS — Z0000 Encounter for general adult medical examination without abnormal findings: Secondary | ICD-10-CM

## 2020-09-18 DIAGNOSIS — Z8349 Family history of other endocrine, nutritional and metabolic diseases: Secondary | ICD-10-CM

## 2020-09-18 DIAGNOSIS — D229 Melanocytic nevi, unspecified: Secondary | ICD-10-CM

## 2020-09-18 DIAGNOSIS — Z23 Encounter for immunization: Secondary | ICD-10-CM | POA: Diagnosis not present

## 2020-09-18 DIAGNOSIS — Z1329 Encounter for screening for other suspected endocrine disorder: Secondary | ICD-10-CM

## 2020-09-18 DIAGNOSIS — L309 Dermatitis, unspecified: Secondary | ICD-10-CM

## 2020-09-18 MED ORDER — TRIAMCINOLONE ACETONIDE 0.1 % EX CREA: 1.0000 "application " | TOPICAL_CREAM | Freq: Two times a day (BID) | CUTANEOUS | 1 refills | Status: DC

## 2020-09-18 NOTE — Progress Notes (Signed)
Subjective:  Patient ID: April Russo, female    DOB: Sep 05, 1990  Age: 30 y.o. MRN: [id number]  CC:  Chief Complaint  Patient presents with   Annual Exam    Patient is here for an annual CPE patient would like to get lab work.    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 Graduated in May. Working Systems developer at [employer].   History of hand dermatitis, some irritation, contact dermatitis with prior glove use at work.  Improved with use of Kenalog previously - still rare use. Has lotion that is CHD compatible that helps - Medline Phytoplex.   Family history of thyroid disease in sister.  No results found for: TSH  Family history of HLD Older brother - mild elevation recently.   No results found for: CHOL, HDL, LDLCALC, LDLDIRECT, TRIG, CHOLHDL  Cancer screening: Cervical cancer screening with OB/GYN, Dr. Melba Coon.  Contraception, previously on [place], recently changed Lo - Loestrin. No pregnancy plans in next year.  Multiple moles. Saw derm in [place], prior bx negative.   Family history of breast cancer in maternal aunt with BRCA mutation.  Depression screen [facility] 2/9 09/18/2020 01/21/2020 07/26/2018 07/14/2017 06/27/2017  Decreased Interest 0 0 0 0 0  Down, Depressed, Hopeless 0 0 0 0 0  PHQ - 2 Score 0 0 0 0 0  Altered sleeping 0 - - - -  Tired, decreased energy 0 - - - -  Change in appetite 0 - - - -  Feeling bad or failure about yourself  0 - - - -  Trouble concentrating 0 - - - -  Moving slowly or fidgety/restless 0 - - - -  Suicidal thoughts 0 - - - -  PHQ-9 Score 0 - - - -  Difficult doing work/chores Not difficult at all - - - -   Immunization History  Administered Date(s) Administered   HPV 9-valent 09/18/2020   Influenza-Unspecified 11/22/2019  Covid vaccine: pfizer x 3 at work.  Tdap in 2014 when discussed at her physical in 2020. HPV vaccine - has not had- requests today.    Vision Screening   Right eye Left eye Both eyes  Without correction _0  With correction     Had lasik in 2019. Some dry eye. Plans to schedule appt.   Dentist: Dr. Kalman Shan at [facility].   STI screening declined.  Married for 5 years, spouse Edison Nasuti.  Alcohol: 4 per week  Tobacco; none.   Exercise: 2-3 d/week. 87mn running. Walking dogs.  Restarting weight based exercise.    History Patient Active Problem List   Diagnosis Date Noted   Family history of gene mutation 09/10/2020   Family history of breast cancer 09/10/2020   Family history of multiple myeloma 09/10/2020   Family history of bladder cancer 09/10/2020   Influenza 02/03/2018   Flu-like symptoms 02/03/2018   Sore throat 02/03/2018   Sinus congestion 02/03/2018   Past Medical History:  Diagnosis Date   Depression    Family history of bladder cancer    Family history of breast cancer    Family history of gene mutation    BRCA1 c.[id number]   Family history of multiple myeloma    Past Surgical History:  Procedure Laterality Date   wisdom tooth removal Bilateral    Not on File Prior to Admission medications   Medication Sig Start Date End Date Taking? Authorizing Provider  cetirizine (ZYRTEC) 10 MG tablet Take 10 mg by mouth as needed for allergies.   Yes [provider]  triamcinolone cream (KENALOG) 0.1 % Apply 1 application topically 2 (two) times daily. As needed 05/30/19  Yes [name], MD  norgestimate-ethinyl estradiol (ORTHO-CYCLEN) 0.25-35 MG-MCG tablet  07/19/19   [provider]   Social History   Socioeconomic History   Marital status: Married    Spouse name: Not on file   Number of children: Not on file   Years of education: Not on file   Highest education level: Not on file  Occupational History   Not on file  Tobacco Use   Smoking status: Never   Smokeless tobacco: Never  Substance and Sexual Activity   Alcohol use: Yes    Comment:  [place]    Drug use: Never   Sexual activity: Yes  Other Topics Concern   Not on file  Social History Narrative   Not on file   Social Determinants of Health   Financial Resource Strain: Not on file  Food Insecurity: Not on file  Transportation Needs: Not on file  Physical Activity: Not on file  Stress: Not on file  Social Connections: Not on file  Intimate Partner Violence: Not on file    Review of Systems 13 point review of systems per patient health survey noted.  Negative other than as indicated above or in HPI.    Objective:   Vitals:   09/18/20 1528  BP: 116/77  Pulse: 66  Resp: 18  Temp: 98.3 F (36.8 C)  TempSrc: Temporal  SpO2: 97%  Weight: 125 lb 9.6 oz (57 kg)  Height: _0  (1.702 m)    Physical Exam Vitals reviewed.  Constitutional:      Appearance: She is well-developed.  HENT:     Head: Normocephalic and atraumatic.     Right Ear: External ear normal.     Left Ear: External ear normal.  Eyes:     Conjunctiva/sclera: Conjunctivae normal.     Pupils: Pupils are equal, round, and reactive to light.  Neck:     Thyroid: No thyromegaly.  Cardiovascular:     Rate and Rhythm: Normal rate and regular rhythm.     Heart sounds: Normal heart sounds. No murmur heard. Pulmonary:     Effort: Pulmonary effort is normal. No respiratory distress.     Breath sounds: Normal breath sounds. No wheezing.  Abdominal:     General: Bowel sounds are normal.     Palpations: Abdomen is soft.     Tenderness: There is no abdominal tenderness.  Musculoskeletal:        General: No tenderness. Normal range of motion.     Cervical back: Normal range of motion and neck supple.  Lymphadenopathy:     Cervical: No cervical adenopathy.  Skin:    General: Skin is warm and dry.     Findings: No rash.  Neurological:     Mental Status: She is alert and oriented to person, place, and time.  Psychiatric:        Behavior: Behavior normal.        Thought Content: Thought content  normal.       Assessment & Plan:  April Russo is a 30 y.o. female . Annual physical exam  - -anticipatory guidance as below in AVS, screening labs above. Health maintenance items as above in HPI discussed/recommended as applicable.   -I did not appreciate significant lymphadenopathy on neck exam, but  advised to return to clinic if any new nodules, or asymmetric areas palpated.  Family history of thyroid disease in sister - Plan: TSH, CANCELED: CBC with Differential/Platelet, CANCELED: TSH  - check tsh.   Screening for hyperlipidemia - Plan: Lipid panel, CANCELED: Lipid panel   Screening for diabetes mellitus - Plan: Comprehensive metabolic panel, CANCELED: Comprehensive metabolic panel  Screening, anemia, deficiency, iron  -Reports borderline low in the past, asymptomatic currently.  Check CBC.  Screening for thyroid disorder - Plan: CBC with Differential/Platelet, CANCELED: CBC with Differential/Platelet, CANCELED: TSH  Need for HPV vaccination - Plan: HPV 9-valent vaccine,Recombinat  -First of 3 HPV vaccines given.  Return interval discussed.  Multiple nevi - Plan: Ambulatory referral to Dermatology, CANCELED: Ambulatory referral to Dermatology  -No recent changes, refer to dermatology for skin cancer screening, ongoing follow-up.  Hand dermatitis - Plan: triamcinolone cream (KENALOG) 0.1 %  -Stable with new over-the-counter lotion, triamcinolone if needed for flares.  Meds ordered this encounter  Medications   triamcinolone cream (KENALOG) 0.1 %    Sig: Apply 1 application topically 2 (two) times daily. As needed    Dispense:  30 g    Refill:  1    Ok to place on hold if needed.   Patient Instructions  1st Gardasil today.  Repeat in 2 months and 6 months. I will refer you to dermatology.  If any new swelling in neck or jawline, certainly return and we can look at that further or even possible ultrasound but I do not appreciate concerns today. If any concerns on labs I  will let you know, but should be available by MyChart in the next few days. Please let me know if there are questions.   Preventive Care 38-58 Years Old, Female Preventive care refers to lifestyle choices and visits with your health care provider that can promote health and wellness. This includes: A yearly physical exam. This is also called an annual wellness visit. Regular dental and eye exams. Immunizations. Screening for certain conditions. Healthy lifestyle choices, such as: Eating a healthy diet. Getting regular exercise. Not using drugs or products that contain nicotine and tobacco. Limiting alcohol use. What can I expect for my preventive care visit? Physical exam Your health care provider may check your: Height and weight. These may be used to calculate your BMI (body mass index). BMI is a measurement that tells if you are at a healthy weight. Heart rate and blood pressure. Body temperature. Skin for abnormal spots. Counseling Your health care provider may ask you questions about your: Past medical problems. Family's medical history. Alcohol, tobacco, and drug use. Emotional well-being. Home life and relationship well-being. Sexual activity. Diet, exercise, and sleep habits. Work and work Statistician. Access to firearms. Method of birth control. Menstrual cycle. Pregnancy history. What immunizations do I need?  Vaccines are usually given at various ages, according to a schedule. Your health care provider will recommend vaccines for you based on your age, medicalhistory, and lifestyle or other factors, such as travel or where you work. What tests do I need?  Blood tests Lipid and cholesterol levels. These may be checked every 5 years starting at age 2. Hepatitis C test. Hepatitis B test. Screening Diabetes screening. This is done by checking your blood sugar (glucose) after you have not eaten for a while (fasting). STD (sexually transmitted disease) testing, if  you are at risk. BRCA-related cancer screening. This may be done if you have a family history of breast, ovarian, tubal, or  peritoneal cancers. Pelvic exam and Pap test. This may be done every 3 years starting at age 71. Starting at age 30, this may be done every 5 years if you have a Pap test in combination with an HPV test. Talk with your health care provider about your test results, treatment options,and if necessary, the need for more tests. Follow these instructions at home: Eating and drinking  Eat a healthy diet that includes fresh fruits and vegetables, whole grains, lean protein, and low-fat dairy products. Take vitamin and mineral supplements as recommended by your health care provider. Do not drink alcohol if: Your health care provider tells you not to drink. You are pregnant, may be pregnant, or are planning to become pregnant. If you drink alcohol: Limit how much you have to 0-1 drink a day. Be aware of how much alcohol is in your drink. In the [place] one drink equals one 12 oz bottle of beer (355 mL), one 5 oz glass of wine (148 mL), or one 1 oz glass of hard liquor (44 mL).  Lifestyle Take daily care of your teeth and gums. Brush your teeth every morning and night with fluoride toothpaste. Floss one time each day. Stay active. Exercise for at least 30 minutes 5 or more days each week. Do not use any products that contain nicotine or tobacco, such as cigarettes, e-cigarettes, and chewing tobacco. If you need help quitting, ask your health care provider. Do not use drugs. If you are sexually active, practice safe sex. Use a condom or other form of protection to prevent STIs (sexually transmitted infections). If you do not wish to become pregnant, use a form of birth control. If you plan to become pregnant, see your health care provider for a prepregnancy visit. Find healthy ways to cope with stress, such as: Meditation, yoga, or listening to music. Journaling. Talking to a  trusted person. Spending time with friends and family. Safety Always wear your seat belt while driving or riding in a vehicle. Do not drive: If you have been drinking alcohol. Do not ride with someone who has been drinking. When you are tired or distracted. While texting. Wear a helmet and other protective equipment during sports activities. If you have firearms in your house, make sure you follow all gun safety procedures. Seek help if you have been physically or sexually abused. What's next? Go to your health care provider once a year for an annual wellness visit. Ask your health care provider how often you should have your eyes and teeth checked. Stay up to date on all vaccines. This information is not intended to replace advice given to you by your health care provider. Make sure you discuss any questions you have with your healthcare provider. Document Revised: 09/30/2019 Document Reviewed: 10/13/2017 Elsevier Patient Education  2022 [employer].   If you have lab work done today you will be contacted with your lab results within the next 2 weeks.  If you have not heard from [place] then please contact us. The fastest way to get your results is to register for My Chart.   IF you received an x-ray today, you will receive an invoice from [facility]. Please contact [facility] at [phone] with questions or concerns regarding your invoice.   IF you received labwork today, you will receive an invoice from [facility]. Please contact [facility] at [phone] with questions or concerns regarding your invoice.   Our billing staff will not be able to assist you with questions regarding  bills from these companies.  You will be contacted with the lab results as soon as they are available. The fastest way to get your results is to activate your My Chart account. Instructions are located on the last page of this paperwork. If you have not heard from [place] regarding the results in 2  weeks, please contact this office.    qqq   Signed,   [name], MD [place] 09/18/20 6:04 PM

## 2020-09-18 NOTE — Patient Instructions (Addendum)
1st Gardasil today.  Repeat in 2 months and 6 months. I will refer you to dermatology.  If any new swelling in neck or jawline, certainly return and we can look at that further or even possible ultrasound but I do not appreciate concerns today. If any concerns on labs I will let you know, but should be available by MyChart in the next few days. Please let me know if there are questions.   Preventive Care 29-29 Years Old, Female Preventive care refers to lifestyle choices and visits with your health care provider that can promote health and wellness. This includes: A yearly physical exam. This is also called an annual wellness visit. Regular dental and eye exams. Immunizations. Screening for certain conditions. Healthy lifestyle choices, such as: Eating a healthy diet. Getting regular exercise. Not using drugs or products that contain nicotine and tobacco. Limiting alcohol use. What can I expect for my preventive care visit? Physical exam Your health care provider may check your: Height and weight. These may be used to calculate your BMI (body mass index). BMI is a measurement that tells if you are at a healthy weight. Heart rate and blood pressure. Body temperature. Skin for abnormal spots. Counseling Your health care provider may ask you questions about your: Past medical problems. Family's medical history. Alcohol, tobacco, and drug use. Emotional well-being. Home life and relationship well-being. Sexual activity. Diet, exercise, and sleep habits. Work and work Statistician. Access to firearms. Method of birth control. Menstrual cycle. Pregnancy history. What immunizations do I need?  Vaccines are usually given at various ages, according to a schedule. Your health care provider will recommend vaccines for you based on your age, medicalhistory, and lifestyle or other factors, such as travel or where you work. What tests do I need?  Blood tests Lipid and cholesterol levels.  These may be checked every 5 years starting at age 29. Hepatitis C test. Hepatitis B test. Screening Diabetes screening. This is done by checking your blood sugar (glucose) after you have not eaten for a while (fasting). STD (sexually transmitted disease) testing, if you are at risk. BRCA-related cancer screening. This may be done if you have a family history of breast, ovarian, tubal, or peritoneal cancers. Pelvic exam and Pap test. This may be done every 3 years starting at age 29. Starting at age 29, this may be done every 5 years if you have a Pap test in combination with an HPV test. Talk with your health care provider about your test results, treatment options,and if necessary, the need for more tests. Follow these instructions at home: Eating and drinking  Eat a healthy diet that includes fresh fruits and vegetables, whole grains, lean protein, and low-fat dairy products. Take vitamin and mineral supplements as recommended by your health care provider. Do not drink alcohol if: Your health care provider tells you not to drink. You are pregnant, may be pregnant, or are planning to become pregnant. If you drink alcohol: Limit how much you have to 0-1 drink a day. Be aware of how much alcohol is in your drink. In the [place] one drink equals one 12 oz bottle of beer (355 mL), one 5 oz glass of wine (148 mL), or one 1 oz glass of hard liquor (44 mL).  Lifestyle Take daily care of your teeth and gums. Brush your teeth every morning and night with fluoride toothpaste. Floss one time each day. Stay active. Exercise for at least 30 minutes 5 or more days each week.  Do not use any products that contain nicotine or tobacco, such as cigarettes, e-cigarettes, and chewing tobacco. If you need help quitting, ask your health care provider. Do not use drugs. If you are sexually active, practice safe sex. Use a condom or other form of protection to prevent STIs (sexually transmitted infections). If  you do not wish to become pregnant, use a form of birth control. If you plan to become pregnant, see your health care provider for a prepregnancy visit. Find healthy ways to cope with stress, such as: Meditation, yoga, or listening to music. Journaling. Talking to a trusted person. Spending time with friends and family. Safety Always wear your seat belt while driving or riding in a vehicle. Do not drive: If you have been drinking alcohol. Do not ride with someone who has been drinking. When you are tired or distracted. While texting. Wear a helmet and other protective equipment during sports activities. If you have firearms in your house, make sure you follow all gun safety procedures. Seek help if you have been physically or sexually abused. What's next? Go to your health care provider once a year for an annual wellness visit. Ask your health care provider how often you should have your eyes and teeth checked. Stay up to date on all vaccines. This information is not intended to replace advice given to you by your health care provider. Make sure you discuss any questions you have with your healthcare provider. Document Revised: 09/30/2019 Document Reviewed: 10/13/2017 Elsevier Patient Education  2022 [employer].   If you have lab work done today you will be contacted with your lab results within the next 2 weeks.  If you have not heard from [place] then please contact us. The fastest way to get your results is to register for My Chart.   IF you received an x-ray today, you will receive an invoice from [facility]. Please contact [facility] at [phone] with questions or concerns regarding your invoice.   IF you received labwork today, you will receive an invoice from [facility]. Please contact [facility] at [phone] with questions or concerns regarding your invoice.   Our billing staff will not be able to assist you with questions regarding bills from these  companies.  You will be contacted with the lab results as soon as they are available. The fastest way to get your results is to activate your My Chart account. Instructions are located on the last page of this paperwork. If you have not heard from [place] regarding the results in 2 weeks, please contact this office.    qqq

## 2020-09-19 LAB — COMPREHENSIVE METABOLIC PANEL
ALT: 12 U/L (ref 0–35)
AST: 16 U/L (ref 0–37)
Albumin: 4.4 g/dL (ref 3.5–5.2)
Alkaline Phosphatase: 35 U/L — ABNORMAL LOW (ref 39–117)
BUN: 14 mg/dL (ref 6–23)
CO2: 26 mEq/L (ref 19–32)
Calcium: 9.8 mg/dL (ref 8.4–10.5)
Chloride: 103 mEq/L (ref 96–112)
Creatinine, Ser: 0.94 mg/dL (ref 0.40–1.20)
GFR: 82.48 mL/min (ref >60.00)
Glucose, Bld: 88 mg/dL (ref 70–99)
Potassium: 3.8 mEq/L (ref 3.5–5.1)
Sodium: 138 mEq/L (ref 135–145)
Total Bilirubin: 0.5 mg/dL (ref 0.2–1.2)
Total Protein: 7.1 g/dL (ref 6.0–8.3)

## 2020-09-19 LAB — CBC WITH DIFFERENTIAL/PLATELET
Basophils Absolute: 0 10*3/uL (ref 0.0–0.1)
Basophils Relative: 0.6 % (ref 0.0–3.0)
Eosinophils Absolute: 0.3 10*3/uL (ref 0.0–0.7)
Eosinophils Relative: 5.8 % — ABNORMAL HIGH (ref 0.0–5.0)
HCT: 39 % (ref 36.0–46.0)
Hemoglobin: 13.1 g/dL (ref 12.0–15.0)
Lymphocytes Relative: 33.4 % (ref 12.0–46.0)
Lymphs Abs: 1.9 10*3/uL (ref 0.7–4.0)
MCHC: 33.7 g/dL (ref 30.0–36.0)
MCV: 86.2 fl (ref 78.0–100.0)
Monocytes Absolute: 0.6 10*3/uL (ref 0.1–1.0)
Monocytes Relative: 9.9 % (ref 3.0–12.0)
Neutro Abs: 2.9 10*3/uL (ref 1.4–7.7)
Neutrophils Relative %: 50.3 % (ref 43.0–77.0)
Platelets: 193 10*3/uL (ref 150.0–400.0)
RBC: 4.52 Mil/uL (ref 3.87–5.11)
RDW: 12.3 % (ref 11.5–15.5)
WBC: 5.8 10*3/uL (ref 4.0–10.5)

## 2020-09-19 LAB — TSH: TSH: 2.31 u[IU]/mL (ref 0.35–5.50)

## 2020-09-19 LAB — LIPID PANEL
Cholesterol: 201 mg/dL — ABNORMAL HIGH (ref 0–200)
HDL: 77.2 mg/dL (ref >39.00)
LDL Cholesterol: 103 mg/dL — ABNORMAL HIGH (ref 0–99)
NonHDL: 123.49
Total CHOL/HDL Ratio: 3
Triglycerides: 101 mg/dL (ref 0.0–149.0)
VLDL: 20.2 mg/dL (ref 0.0–40.0)

## 2020-09-23 ENCOUNTER — Other Ambulatory Visit: Payer: Self-pay

## 2020-09-23 ENCOUNTER — Inpatient Hospital Stay: Payer: 59 | Attending: Genetic Counselor

## 2020-09-23 LAB — GENETIC SCREENING ORDER

## 2020-10-21 ENCOUNTER — Telehealth: Payer: Self-pay | Admitting: Licensed Clinical Social Worker

## 2020-10-21 ENCOUNTER — Ambulatory Visit: Payer: Self-pay | Admitting: Licensed Clinical Social Worker

## 2020-10-21 ENCOUNTER — Encounter: Payer: Self-pay | Admitting: Licensed Clinical Social Worker

## 2020-10-21 DIAGNOSIS — Z803 Family history of malignant neoplasm of breast: Secondary | ICD-10-CM

## 2020-10-21 DIAGNOSIS — Z807 Family history of other malignant neoplasms of lymphoid, hematopoietic and related tissues: Secondary | ICD-10-CM

## 2020-10-21 DIAGNOSIS — Z1379 Encounter for other screening for genetic and chromosomal anomalies: Secondary | ICD-10-CM | POA: Insufficient documentation

## 2020-10-21 DIAGNOSIS — Z8481 Family history of carrier of genetic disease: Secondary | ICD-10-CM

## 2020-10-21 DIAGNOSIS — Z8052 Family history of malignant neoplasm of bladder: Secondary | ICD-10-CM

## 2020-10-21 NOTE — Telephone Encounter (Signed)
Revealed negative genetic testing. The known familial variant in BRCA1 was not identified. This normal result is reassuring.  It is unlikely that there is an increased risk of cancer due to a mutation in one of these genes.  However, genetic testing is not perfect, and cannot definitively rule out a hereditary cause.  It will be important for her to keep in contact with genetics to learn if any additional testing may be needed in the future.

## 2020-10-21 NOTE — Progress Notes (Signed)
HPI:  Ms. Barba was previously seen in the [facility] due to a family history of BRCA1 mutation, family history of cancer, and concerns regarding a hereditary predisposition to cancer. Please refer to our prior cancer genetics clinic note for more information regarding our discussion, assessment and recommendations, at the time. Ms. Kayes recent genetic test results were disclosed to her, as were recommendations warranted by these results. These results and recommendations are discussed in more detail below.  CANCER HISTORY:  Oncology History   No history exists.    FAMILY HISTORY:  We obtained a detailed, 4-generation family history.  Significant diagnoses are listed below: Family History  Problem Relation Age of Onset   Stroke Sister 34   Breast cancer Maternal Aunt 56       both breasts, b/l mastectomies, ovaries removed   BRCA 1/2 Maternal Aunt        BRCA1 c.81-1G>A   Other Maternal Uncle        BRCA1 negative   Emphysema Maternal Grandmother    Multiple myeloma Paternal Grandmother 39   Heart disease Paternal Grandfather    Bladder Cancer Paternal Grandfather 83   Breast cancer Other        maternal great-aunt (MGF's sister)   Cancer Other        multiple of her mother's cousins    Ms. Guarino does not have children. She has two brothers (ages 10 and 27) and three sisters (ages 29-39). None of these relatives have had cancer.   Ms. Fedorko mother is alive at age 72 without cancer, and she has decided to not have testing for the familial BRCA1 mutation. There is one maternal aunt and seven maternal uncles. Her aunt had bilateral breast cancer in her 64s or 60s and had genetic testing that revealed a mutation in the BRCA1 gene called c.81-1G>A. She had bilateral mastectomies and had her ovaries removed. One uncle had negative genetic testing for the BRCA1 mutation. There is no known cancer among maternal cousins. Ms. Tegeler maternal grandmother died at age  38 without cancer. Her maternal grandfather died at age 14 without cancer. A maternal great-aunt (MGF's sister) had breast cancer, and multiple maternal first cousins once removed (MGF's nieces) have had cancer.   Ms. Cordon father is alive at age 58 without cancer. There is one paternal aunt and one paternal uncle. There is no known cancer among paternal aunts/uncles or paternal cousins. Ms. Dayley paternal grandmother died at age 27 with multiple myeloma (diagnosed age 71). Her paternal grandfather died at age 23 with bladder cancer (diagnosed age 86).    Ms. Yeske is aware of previous family history of genetic testing for hereditary cancer risks. Patient's maternal ancestors are of [place] and otherwise unknown descent, and paternal ancestors are of [place], [place], and [place] descent. There is no reported Ashkenazi Jewish ancestry. There is no known consanguinity.  GENETIC TEST RESULTS: Genetic testing reported out on 10/17/2020 through the [facility] Common Hereditary Cancer+RNA cancer panel found no pathogenic mutations.   The Common Hereditary Cancers Panel + RNA offered by [facility] includes sequencing and/or deletion duplication testing of the following 47 genes: APC, ATM, AXIN2, BARD1, BMPR1A, BRCA1, BRCA2, BRIP1, CDH1, CDKN2A (p14ARF), CDKN2A (p16INK4a), CKD4, CHEK2, CTNNA1, DICER1, EPCAM (Deletion/duplication testing only), GREM1 (promoter region deletion/duplication testing only), KIT, MEN1, MLH1, MSH2, MSH3, MSH6, MUTYH, NBN, NF1, NHTL1, PALB2, PDGFRA, PMS2, POLD1, POLE, PTEN, RAD50, RAD51C, RAD51D, SDHB, SDHC, SDHD, SMAD4, SMARCA4. STK11, TP53, TSC1, TSC2, and VHL.  The  following genes were evaluated for sequence changes only: SDHA and HOXB13 c.251G>A variant only.   The test report has been scanned into EPIC and is located under the Molecular Pathology section of the Results Review tab.  A portion of the result report is included below for reference.    We discussed that because current  genetic testing is not perfect, it is possible there may be a gene mutation in one of these genes that current testing cannot detect, but that chance is small.  There could be another gene that has not yet been discovered, or that we have not yet tested, that is responsible for the cancer diagnoses in the family. It is also possible there is a hereditary cause for the cancer in the family that Ms. Azucena did not inherit and therefore was not identified in her testing.  Therefore, it is important to remain in touch with cancer genetics in the future so that we can continue to offer Ms. Stetzel the most up to date genetic testing.   We recommended Ms. Hefter pursue testing for the familial hereditary cancer gene mutation called BRCA1 c.81-1GT>A. Ms. Warbington test was normal and did not reveal the familial mutation. We call this result a true negative result because the cancer-causing mutation was identified in Ms. Nace's family, and she did not inherit it.  Given this negative result, Ms. Ursin's chances of developing BRCA12-related cancers are the same as they are in the general population.    ADDITIONAL GENETIC TESTING: We discussed with Ms. Nazario that her genetic testing was fairly extensive.  If there are genes identified to increase cancer risk that can be analyzed in the future, we would be happy to discuss and coordinate this testing at that time.    CANCER SCREENING RECOMMENDATIONS: Ms. Vezina test result is considered negative (normal).    While reassuring, this does not definitively rule out a hereditary predisposition to cancer. It is still possible that there could be genetic mutations that are undetectable by current technology. There could be genetic mutations in genes that have not been tested or identified to increase cancer risk.  Therefore, it is recommended she continue to follow the cancer management and screening guidelines provided by her primary healthcare provider.   An individual's  cancer risk and medical management are not determined by genetic test results alone. Overall cancer risk assessment incorporates additional factors, including personal medical history, family history, and any available genetic information that may result in a personalized plan for cancer prevention and surveillance.  Based on Ms. Knoble's personal and family history of cancer as well as her genetic test results, risk model Harriett Rush was used to estimate her risk of developing breast cancer. This estimates her lifetime risk of developing breast cancer to be approximately 11%.  The patient's lifetime breast cancer risk is a preliminary estimate based on available information using one of several models endorsed by the [employer] (ACS). The ACS recommends consideration of breast MRI screening as an adjunct to mammography for patients at high risk (defined as 20% or greater lifetime risk).  This risk estimate can change over time and may be repeated to reflect new information in her personal or family history in the future.    RECOMMENDATIONS FOR FAMILY MEMBERS:  Relatives in this family might be at some increased risk of developing cancer, over the general population risk, simply due to the family history of cancer.  We recommended female relatives in this family have a yearly  mammogram beginning at age 69, or 51 years younger than the earliest onset of cancer, an annual clinical breast exam, and perform monthly breast self-exams. Female relatives in this family should also have a gynecological exam as recommended by their primary provider.  All family members should be referred for colonoscopy starting at age 81.    It is also possible there is a hereditary cause for the cancer in Ms. Prinsen's family that she did not inherit and therefore was not identified in her.  Based on Ms. Chaisson's family history, we recommended maternal relatives have genetic counseling and testing. Ms. Ponciano will let us  know if we can be of any assistance in coordinating genetic counseling and/or testing for these family members.  FOLLOW-UP: Lastly, we discussed with Ms. Suchocki that cancer genetics is a rapidly advancing field and it is possible that new genetic tests will be appropriate for her and/or her family members in the future. We encouraged her to remain in contact with cancer genetics on an annual basis so we can update her personal and family histories and let her know of advances in cancer genetics that may benefit this family.   Our contact number was provided. Ms. Flesch questions were answered to her satisfaction, and she knows she is welcome to call us at anytime with additional questions or concerns.   Faith Rogue, MS, [facility] Genetic Counselor [email or web address] Phone: [phone]

## 2020-11-18 ENCOUNTER — Ambulatory Visit: Payer: 59

## 2020-11-19 ENCOUNTER — Ambulatory Visit (INDEPENDENT_AMBULATORY_CARE_PROVIDER_SITE_OTHER): Payer: 59 | Admitting: Family Medicine

## 2020-11-19 ENCOUNTER — Other Ambulatory Visit: Payer: Self-pay

## 2020-11-19 DIAGNOSIS — Z23 Encounter for immunization: Secondary | ICD-10-CM

## 2021-01-06 DIAGNOSIS — Z23 Encounter for immunization: Secondary | ICD-10-CM

## 2021-01-06 NOTE — Progress Notes (Signed)
April Russo is a 30 y.o. female presents to the office today for HPV Vaccine per physician's orders.   [name]

## 2021-04-11 ENCOUNTER — Encounter: Payer: Self-pay | Admitting: Family Medicine

## 2021-04-13 NOTE — Telephone Encounter (Signed)
Called patient to follow up and she stated that she was [facility] about these concerns for an UTI.

## 2021-05-20 ENCOUNTER — Ambulatory Visit (INDEPENDENT_AMBULATORY_CARE_PROVIDER_SITE_OTHER): Payer: 59 | Admitting: Family Medicine

## 2021-05-20 DIAGNOSIS — Z23 Encounter for immunization: Secondary | ICD-10-CM | POA: Diagnosis not present

## 2021-05-20 NOTE — Progress Notes (Signed)
April Russo is a 31 y.o. female presents to the office today for HPV injections, per physician's orders. ? ?[name] ? ?

## 2021-05-21 ENCOUNTER — Ambulatory Visit: Payer: 59

## 2021-05-22 ENCOUNTER — Encounter: Payer: Self-pay | Admitting: Family Medicine

## 2021-07-28 ENCOUNTER — Ambulatory Visit: Payer: 59 | Admitting: Physician Assistant

## 2021-09-21 LAB — HM PAP SMEAR

## 2021-09-23 ENCOUNTER — Ambulatory Visit (INDEPENDENT_AMBULATORY_CARE_PROVIDER_SITE_OTHER): Payer: 59 | Admitting: Family Medicine

## 2021-09-23 ENCOUNTER — Encounter: Payer: Self-pay | Admitting: Family Medicine

## 2021-09-23 VITALS — BP 116/70 | HR 84 | Temp 98.8°F | Resp 16 | Ht 67.0 in | Wt 128.2 lb

## 2021-09-23 DIAGNOSIS — Z0000 Encounter for general adult medical examination without abnormal findings: Secondary | ICD-10-CM

## 2021-09-23 DIAGNOSIS — Z1329 Encounter for screening for other suspected endocrine disorder: Secondary | ICD-10-CM | POA: Diagnosis not present

## 2021-09-23 DIAGNOSIS — Z8349 Family history of other endocrine, nutritional and metabolic diseases: Secondary | ICD-10-CM | POA: Diagnosis not present

## 2021-09-23 DIAGNOSIS — Z13220 Encounter for screening for lipoid disorders: Secondary | ICD-10-CM | POA: Diagnosis not present

## 2021-09-23 DIAGNOSIS — M533 Sacrococcygeal disorders, not elsewhere classified: Secondary | ICD-10-CM

## 2021-09-23 DIAGNOSIS — Z131 Encounter for screening for diabetes mellitus: Secondary | ICD-10-CM

## 2021-09-23 LAB — COMPREHENSIVE METABOLIC PANEL
ALT: 13 U/L (ref 0–35)
AST: 15 U/L (ref 0–37)
Albumin: 4.5 g/dL (ref 3.5–5.2)
Alkaline Phosphatase: 38 U/L — ABNORMAL LOW (ref 39–117)
BUN: 12 mg/dL (ref 6–23)
CO2: 26 mEq/L (ref 19–32)
Calcium: 9.7 mg/dL (ref 8.4–10.5)
Chloride: 104 mEq/L (ref 96–112)
Creatinine, Ser: 0.86 mg/dL (ref 0.40–1.20)
GFR: 91.12 mL/min (ref >60.00)
Glucose, Bld: 76 mg/dL (ref 70–99)
Potassium: 4.1 mEq/L (ref 3.5–5.1)
Sodium: 141 mEq/L (ref 135–145)
Total Bilirubin: 0.6 mg/dL (ref 0.2–1.2)
Total Protein: 7.3 g/dL (ref 6.0–8.3)

## 2021-09-23 LAB — LIPID PANEL
Cholesterol: 205 mg/dL — ABNORMAL HIGH (ref 0–200)
HDL: 72.9 mg/dL (ref >39.00)
LDL Cholesterol: 111 mg/dL — ABNORMAL HIGH (ref 0–99)
NonHDL: 132.02
Total CHOL/HDL Ratio: 3
Triglycerides: 106 mg/dL (ref 0.0–149.0)
VLDL: 21.2 mg/dL (ref 0.0–40.0)

## 2021-09-23 LAB — TSH: TSH: 2.82 u[IU]/mL (ref 0.35–5.50)

## 2021-09-23 NOTE — Progress Notes (Signed)
Subjective:  Patient ID: April Russo, female    DOB: 1991/01/01  Age: 31 y.o. MRN: [id number]  CC:  Chief Complaint  Patient presents with   Annual Exam    HPI April Russo presents for Annual Exam PCP, me GYN, Dr. Melba Coon  History of hand dermatitis, possible contact dermatitis with work.  Improved with topical triamcinolone previously and lotion that was CHD compatible. 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 Intermittent use of triamcinolone.   Family history of thyroid disease in sister, older brother with mild hyperlipidemia.  Contraception, on low Loestrin prior. Changed to IUD - Kyleena yesterday after concerns of OCP's with FH of breast CA, estrogen.   No pregnancy plans in the next year.  STI screening declined, monogamous with spouse, Edison Nasuti.   [place]ney trip planned in December for 31th birthday.   Derm eval in the past for multiple moles with biopsies negative, screening in May - no concerns, 5 year follow up, Dr. Martin Majestic  Family history of breast cancer maternal aunt with BRCA mutation. Older sister recently diagnosed with breast cancer 10 days ago - still determining type to assist with recommendations on other testing.   Patient with negative BRCA testing last year. Considering starting imaging sooner, but not yet.     09/23/2021    9:30 AM 09/23/2021    9:28 AM 09/18/2020    3:28 PM 01/21/2020    3:16 PM 07/26/2018    3:08 PM  Depression screen PHQ 2/9  Decreased Interest 0 0 0 0 0  Down, Depressed, Hopeless 0 0 0 0 0  PHQ - 2 Score 0 0 0 0 0  Altered sleeping 0  0    Tired, decreased energy 1  0    Change in appetite 0  0    Feeling bad or failure about yourself  0  0    Trouble concentrating 1  0    Moving slowly or fidgety/restless 0  0    Suicidal thoughts 0  0    PHQ-9 Score 2  0    Difficult doing work/chores   Not difficult at all    Usually sleeping well - no meds needed.   Health Maintenance  Topic Date Due   INFLUENZA VACCINE  09/15/2021    COVID-19 Vaccine (1) 10/09/2021 (Originally 07/17/1991)   Hepatitis C Screening  09/24/2022 (Originally 01/16/2010)   HIV Screening  09/24/2022 (Originally 01/17/2007)   TETANUS/TDAP  01/16/2023   PAP-Cervical Cytology Screening  08/26/2023   PAP SMEAR-Modifier  08/26/2023   HPV VACCINES  Completed  HPV vaccination in August, October 22, with third dose April 5th.  COVID bivalent booster: has not had. Considering with flu vaccine.   Immunization History  Administered Date(s) Administered   HPV 9-valent 09/18/2020, 01/06/2021, 05/20/2021   Influenza-Unspecified 11/22/2019, 11/14/2020    No results found. Prior LASIK in 2019. No recent optho eval - will schedule.   Dental: Dr. Kalman Shan, [facility].  Routine visits  Alcohol: 3-4 per week, less recently with training for marathon.   Tobacco: none  Exercise: currently training for marathon.  Short, mid, short, long run per week - 46mles per week. Over 3028m per week.  Bilateral hip pain, tightness for entire day after running. Lower SI joint area, stretching for about 1515m    History Patient Active Problem List   Diagnosis Date Noted   Genetic testing 10/21/2020   Family history of gene mutation 09/10/2020   Family history of breast cancer 09/10/2020  Family history of multiple myeloma 09/10/2020   Family history of bladder cancer 09/10/2020   Influenza 02/03/2018   Flu-like symptoms 02/03/2018   Sore throat 02/03/2018   Sinus congestion 02/03/2018   Past Medical History:  Diagnosis Date   Depression    Family history of bladder cancer    Family history of breast cancer    Family history of gene mutation    BRCA1 c.81-1G>A   Family history of multiple myeloma    Past Surgical History:  Procedure Laterality Date   wisdom tooth removal Bilateral    No Known Allergies Prior to Admission medications   Medication Sig Start Date End Date Taking? Authorizing Provider  cetirizine (ZYRTEC) 10 MG tablet Take 10 mg  by mouth as needed for allergies.   Yes [provider]  triamcinolone cream (KENALOG) 0.1 % Apply 1 application topically 2 (two) times daily. As needed 09/18/20  Yes [name], MD   Social History   Socioeconomic History   Marital status: Married    Spouse name: Not on file   Number of children: Not on file   Years of education: Not on file   Highest education level: Not on file  Occupational History   Not on file  Tobacco Use   Smoking status: Never   Smokeless tobacco: Never  Substance and Sexual Activity   Alcohol use: Yes    Comment: [place]    Drug use: Never   Sexual activity: Yes  Other Topics Concern   Not on file  Social History Narrative   Not on file   Social Determinants of Health   Financial Resource Strain: Not on file  Food Insecurity: Not on file  Transportation Needs: Not on file  Physical Activity: Not on file  Stress: Not on file  Social Connections: Not on file  Intimate Partner Violence: Not on file    Review of Systems 13 point review of systems per patient health survey noted.  Negative other than as indicated above or in HPI.    Objective:   Vitals:   09/23/21 0925  BP: 116/70  Pulse: 84  Resp: 16  Temp: 98.8 F (37.1 C)  TempSrc: Oral  SpO2: 98%  Weight: 128 lb 3.2 oz (58.2 kg)  Height: _0  (1.702 m)     Physical Exam Constitutional:      Appearance: She is well-developed.  HENT:     Head: Normocephalic and atraumatic.     Right Ear: External ear normal.     Left Ear: External ear normal.  Eyes:     Conjunctiva/sclera: Conjunctivae normal.     Pupils: Pupils are equal, round, and reactive to light.  Neck:     Thyroid: No thyromegaly.  Cardiovascular:     Rate and Rhythm: Normal rate and regular rhythm.     Heart sounds: Normal heart sounds. No murmur heard. Pulmonary:     Effort: Pulmonary effort is normal. No respiratory distress.     Breath sounds: Normal breath sounds. No wheezing.  Abdominal:      General: Bowel sounds are normal.     Palpations: Abdomen is soft.     Tenderness: There is no abdominal tenderness.  Musculoskeletal:        General: No tenderness. Normal range of motion.     Cervical back: Normal range of motion and neck supple.     Comments: Lumbar spine, no bony tenderness or midline tenderness.  Pain-free range of motion.  Negative seated straight  leg raise.  Locates area of discomfort at SI joints and slightly lower but nontender on exam, pain-free with exam in office, negative stork testing. Pain-free range of motion of hips.  Slightly tight hamstrings.  Pain-free range of motion of knee, ankle.  Lymphadenopathy:     Cervical: No cervical adenopathy.  Skin:    General: Skin is warm and dry.     Findings: No rash.  Neurological:     Mental Status: She is alert and oriented to person, place, and time.  Psychiatric:        Behavior: Behavior normal.        Thought Content: Thought content normal.        Assessment & Plan:  Arlen Legendre is a 31 y.o. female . Annual physical exam  - -anticipatory guidance as below in AVS, screening labs above. Health maintenance items as above in HPI discussed/recommended as applicable.   Sacroiliac pain - Plan: Ambulatory referral to Sports Medicine  -Recent posterior hip discomfort with training for marathon.  Based on location and description possible SI joint versus component of piriformis syndrome.  Reassuring exam at present.  Will refer to [facility] sports medicine to discuss further and discuss specific stretches/management with marathon training.  Family history of thyroid disease in sister - Plan: TSH Screening for thyroid disorder - Plan: TSH  Screening for diabetes mellitus - Plan: Comprehensive metabolic panel  Screening for hyperlipidemia - Plan: Comprehensive metabolic panel, Lipid panel   No orders of the defined types were placed in this encounter.  Patient Instructions  Based on location of discomfort I  suspect you are having some SI joint pain or just some tight musculature.  Continue stretches but I will refer you to sports medicine at [facility] to evaluate this further and discuss other treatments and any recommendations regarding training.  Let me know if you do not hear from them or if any questions.  If any concerns on labs I will let you know.  Thanks for coming in today and take care.   Preventive Care 17-4 Years Old, Female Preventive care refers to lifestyle choices and visits with your health care provider that can promote health and wellness. Preventive care visits are also called wellness exams. What can I expect for my preventive care visit? Counseling During your preventive care visit, your health care provider may ask about your: Medical history, including: Past medical problems. Family medical history. Pregnancy history. Current health, including: Menstrual cycle. Method of birth control. Emotional well-being. Home life and relationship well-being. Sexual activity and sexual health. Lifestyle, including: Alcohol, nicotine or tobacco, and drug use. Access to firearms. Diet, exercise, and sleep habits. Work and work Statistician. Sunscreen use. Safety issues such as seatbelt and bike helmet use. Physical exam Your health care provider may check your: Height and weight. These may be used to calculate your BMI (body mass index). BMI is a measurement that tells if you are at a healthy weight. Waist circumference. This measures the distance around your waistline. This measurement also tells if you are at a healthy weight and may help predict your risk of certain diseases, such as type 2 diabetes and high blood pressure. Heart rate and blood pressure. Body temperature. Skin for abnormal spots. What immunizations do I need?  Vaccines are usually given at various ages, according to a schedule. Your health care provider will recommend vaccines for you based on your age, medical  history, and lifestyle or other factors, such as travel or where you  work. What tests do I need? Screening Your health care provider may recommend screening tests for certain conditions. This may include: Pelvic exam and Pap test. Lipid and cholesterol levels. Diabetes screening. This is done by checking your blood sugar (glucose) after you have not eaten for a while (fasting). Hepatitis B test. Hepatitis C test. HIV (human immunodeficiency virus) test. STI (sexually transmitted infection) testing, if you are at risk. BRCA-related cancer screening. This may be done if you have a family history of breast, ovarian, tubal, or peritoneal cancers. Talk with your health care provider about your test results, treatment options, and if necessary, the need for more tests. Follow these instructions at home: Eating and drinking  Eat a healthy diet that includes fresh fruits and vegetables, whole grains, lean protein, and low-fat dairy products. Take vitamin and mineral supplements as recommended by your health care provider. Do not drink alcohol if: Your health care provider tells you not to drink. You are pregnant, may be pregnant, or are planning to become pregnant. If you drink alcohol: Limit how much you have to 0-1 drink a day. Know how much alcohol is in your drink. In the [place] one drink equals one 12 oz bottle of beer (355 mL), one 5 oz glass of wine (148 mL), or one 1 oz glass of hard liquor (44 mL). Lifestyle Brush your teeth every morning and night with fluoride toothpaste. Floss one time each day. Exercise for at least 30 minutes 5 or more days each week. Do not use any products that contain nicotine or tobacco. These products include cigarettes, chewing tobacco, and vaping devices, such as e-cigarettes. If you need help quitting, ask your health care provider. Do not use drugs. If you are sexually active, practice safe sex. Use a condom or other form of protection to prevent STIs. If  you do not wish to become pregnant, use a form of birth control. If you plan to become pregnant, see your health care provider for a prepregnancy visit. Find healthy ways to manage stress, such as: Meditation, yoga, or listening to music. Journaling. Talking to a trusted person. Spending time with friends and family. Minimize exposure to UV radiation to reduce your risk of skin cancer. Safety Always wear your seat belt while driving or riding in a vehicle. Do not drive: If you have been drinking alcohol. Do not ride with someone who has been drinking. If you have been using any mind-altering substances or drugs. While texting. When you are tired or distracted. Wear a helmet and other protective equipment during sports activities. If you have firearms in your house, make sure you follow all gun safety procedures. Seek help if you have been physically or sexually abused. What's next? Go to your health care provider once a year for an annual wellness visit. Ask your health care provider how often you should have your eyes and teeth checked. Stay up to date on all vaccines. This information is not intended to replace advice given to you by your health care provider. Make sure you discuss any questions you have with your health care provider. Document Revised: 07/30/2020 Document Reviewed: 07/30/2020 Elsevier Patient Education  [place],   [name], MD [address], [facility] 09/23/21 5:40 PM

## 2021-09-23 NOTE — Patient Instructions (Signed)
Based on location of discomfort I suspect you are having some SI joint pain or just some tight musculature.  Continue stretches but I will refer you to sports medicine at [facility] to evaluate this further and discuss other treatments and any recommendations regarding training.  Let me know if you do not hear from them or if any questions.  If any concerns on labs I will let you know.  Thanks for coming in today and take care.   Preventive Care 61-68 Years Old, Female Preventive care refers to lifestyle choices and visits with your health care provider that can promote health and wellness. Preventive care visits are also called wellness exams. What can I expect for my preventive care visit? Counseling During your preventive care visit, your health care provider may ask about your: Medical history, including: Past medical problems. Family medical history. Pregnancy history. Current health, including: Menstrual cycle. Method of birth control. Emotional well-being. Home life and relationship well-being. Sexual activity and sexual health. Lifestyle, including: Alcohol, nicotine or tobacco, and drug use. Access to firearms. Diet, exercise, and sleep habits. Work and work Statistician. Sunscreen use. Safety issues such as seatbelt and bike helmet use. Physical exam Your health care provider may check your: Height and weight. These may be used to calculate your BMI (body mass index). BMI is a measurement that tells if you are at a healthy weight. Waist circumference. This measures the distance around your waistline. This measurement also tells if you are at a healthy weight and may help predict your risk of certain diseases, such as type 2 diabetes and high blood pressure. Heart rate and blood pressure. Body temperature. Skin for abnormal spots. What immunizations do I need?  Vaccines are usually given at various ages, according to a schedule. Your health care provider will recommend vaccines  for you based on your age, medical history, and lifestyle or other factors, such as travel or where you work. What tests do I need? Screening Your health care provider may recommend screening tests for certain conditions. This may include: Pelvic exam and Pap test. Lipid and cholesterol levels. Diabetes screening. This is done by checking your blood sugar (glucose) after you have not eaten for a while (fasting). Hepatitis B test. Hepatitis C test. HIV (human immunodeficiency virus) test. STI (sexually transmitted infection) testing, if you are at risk. BRCA-related cancer screening. This may be done if you have a family history of breast, ovarian, tubal, or peritoneal cancers. Talk with your health care provider about your test results, treatment options, and if necessary, the need for more tests. Follow these instructions at home: Eating and drinking  Eat a healthy diet that includes fresh fruits and vegetables, whole grains, lean protein, and low-fat dairy products. Take vitamin and mineral supplements as recommended by your health care provider. Do not drink alcohol if: Your health care provider tells you not to drink. You are pregnant, may be pregnant, or are planning to become pregnant. If you drink alcohol: Limit how much you have to 0-1 drink a day. Know how much alcohol is in your drink. In the [place] one drink equals one 12 oz bottle of beer (355 mL), one 5 oz glass of wine (148 mL), or one 1 oz glass of hard liquor (44 mL). Lifestyle Brush your teeth every morning and night with fluoride toothpaste. Floss one time each day. Exercise for at least 30 minutes 5 or more days each week. Do not use any products that contain nicotine or tobacco. These  products include cigarettes, chewing tobacco, and vaping devices, such as e-cigarettes. If you need help quitting, ask your health care provider. Do not use drugs. If you are sexually active, practice safe sex. Use a condom or other form  of protection to prevent STIs. If you do not wish to become pregnant, use a form of birth control. If you plan to become pregnant, see your health care provider for a prepregnancy visit. Find healthy ways to manage stress, such as: Meditation, yoga, or listening to music. Journaling. Talking to a trusted person. Spending time with friends and family. Minimize exposure to UV radiation to reduce your risk of skin cancer. Safety Always wear your seat belt while driving or riding in a vehicle. Do not drive: If you have been drinking alcohol. Do not ride with someone who has been drinking. If you have been using any mind-altering substances or drugs. While texting. When you are tired or distracted. Wear a helmet and other protective equipment during sports activities. If you have firearms in your house, make sure you follow all gun safety procedures. Seek help if you have been physically or sexually abused. What's next? Go to your health care provider once a year for an annual wellness visit. Ask your health care provider how often you should have your eyes and teeth checked. Stay up to date on all vaccines. This information is not intended to replace advice given to you by your health care provider. Make sure you discuss any questions you have with your health care provider. Document Revised: 07/30/2020 Document Reviewed: 07/30/2020 Elsevier Patient Education  [place].

## 2021-09-28 ENCOUNTER — Encounter: Payer: Self-pay | Admitting: Family Medicine

## 2021-09-28 ENCOUNTER — Ambulatory Visit (INDEPENDENT_AMBULATORY_CARE_PROVIDER_SITE_OTHER): Payer: 59 | Admitting: Family Medicine

## 2021-09-28 VITALS — BP 117/83 | Ht 67.0 in | Wt 125.0 lb

## 2021-09-28 DIAGNOSIS — M533 Sacrococcygeal disorders, not elsewhere classified: Secondary | ICD-10-CM

## 2021-09-28 NOTE — Progress Notes (Signed)
PCP: [name], MD  Subjective:   HPI: Patient is a 31 y.o. female here for hip pain.  Preparing to run a marathon in November Has had low back/hip pain for 3-4 weeks, worse after running typically > 10 miles Has been training since mid-may Better at rest, not worse at night. No red flag sx's/numbness or tingling Running outside mostly and wearing proper footwear Will take advil sometimes but otherwise not icing/heating Doing some hip stretches at home, but does not have a solid regimen she follows No bowel/bladder dysfunction.  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     triamcinolone cream (KENALOG) 0.1 % Apply 1 application topically 2 (two) times daily. As needed 30 g 1   No current facility-administered medications on file prior to visit.    Past Surgical History:  Procedure Laterality Date   wisdom tooth removal Bilateral     No Known Allergies  BP 117/83   Ht 5' 7"  (1.702 m)   Wt 125 lb (56.7 kg)   BMI 19.58 kg/m       No data to display              No data to display              Objective:  Physical Exam:  Gen: NAD, comfortable in exam room  Hip, bilateral: . No obvious rash, erythema, ecchymosis, or edema. Passive Log Roll equivalent b/l without restriction. ROM full in all directions; Strength 5/5 in IR/ER/Flex/Ext/Abd/Add. Pelvic alignment unremarkable to inspection and palpation. Non-antalgic gait without trendelenburg / unsteadiness. Greater trochanter without tenderness to palpation. No tenderness over piriformis. Mild SI joint tenderness and normal minimal SI movement.  Provocative  Testing:    - FABER test with restricted motion bilaterally, worse on left   - FADIR test negative   - Ober's test: NEG   - Thomas test: NEG   Assessment & Plan:  1. Bilateral SI joint dysfunction Discussed pathophysiology and that she may continue running per tolerance Encouraged Voltaren gel up to 4 times daily on SI joint area Provided home stretches and encouraged to do them twice daily morning and night No benefit for corticosteroid injections at this time No red flag symptoms or need for imaging at this time Provided information regarding proper training for high mileage running. Patient following appropriate training regimen.

## 2021-09-28 NOTE — Patient Instructions (Signed)
You have SI joint dysfunction. Try voltaren gel up to 4 times a day topically (I would apply before you go on your long runs). Heat 15 minutes at a time as needed. The stretches are very important - do these once or twice a day, hold each stretch for 20-30 seconds. Do the hip side raises with 2 pound ankle weight 3 sets of 10 about 3 days a week. The running groups we talked about were [employer], [employer] [facility], and [employer].  [employer] (Thad) sets up long runs every Saturday morning of various distances and would be a good one for your long runs. Consider chiropractic care [facility] if you needed to do this). Follow up with me in 6 weeks or as needed if you're doing well.

## 2021-11-09 ENCOUNTER — Ambulatory Visit: Payer: 59 | Admitting: Family Medicine

## 2022-02-19 ENCOUNTER — Ambulatory Visit (INDEPENDENT_AMBULATORY_CARE_PROVIDER_SITE_OTHER): Payer: 59 | Admitting: Family Medicine

## 2022-02-19 ENCOUNTER — Encounter: Payer: Self-pay | Admitting: Family Medicine

## 2022-02-19 VITALS — BP 114/62 | HR 79 | Temp 99.1°F | Ht 67.0 in | Wt 126.2 lb

## 2022-02-19 DIAGNOSIS — E049 Nontoxic goiter, unspecified: Secondary | ICD-10-CM | POA: Diagnosis not present

## 2022-02-19 LAB — TSH: TSH: 2.61 u[IU]/mL (ref 0.35–5.50)

## 2022-02-19 NOTE — Progress Notes (Signed)
Subjective:  Patient ID: April Russo, female    DOB: [birth year]  Age: 32 y.o. MRN: [id number]  CC:  Chief Complaint  Patient presents with   Thyroid Problem    Pt has had her dentist note 2 separate occasions note enlargement, notes when laying down can see some bulging to the Rt     HPI April Russo presents for   Thyroid enlargement As above has been noted at her dentist on 2 separate occasions, - Dr. Kalman Shan at [facility].  notices some prominence of the right side with lying down.  No difficulty with swallowing usually, but has noticed sensation with neck turned right.  Video on phone - appears to have spasm of R SCM.  Normal TSH in 2022 and then again August 9 of last year.     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 Authorizing Provider  cetirizine (ZYRTEC) 10 MG tablet Take 10 mg by mouth as needed for allergies.   Yes [provider]  triamcinolone cream (KENALOG) 0.1 % Apply 1 application topically 2 (two) times daily. As needed 09/18/20  Yes [name], MD   Social History   Socioeconomic History   Marital status: Married    Spouse name: Not on file   Number of children: Not on file   Years of  education: Not on file   Highest education level: Not on file  Occupational History   Not on file  Tobacco Use   Smoking status: Never   Smokeless tobacco: Never  Substance and Sexual Activity   Alcohol use: Yes    Comment: [place]    Drug use: Never   Sexual activity: Yes  Other Topics Concern   Not on file  Social History Narrative   Not on file   Social Determinants of Health   Financial Resource Strain: Not on file  Food Insecurity: Not on file  Transportation Needs: Not on file  Physical Activity: Not on file  Stress: Not on file  Social Connections: Not on file  Intimate Partner Violence: Not on file    Review of Systems   Objective:   Vitals:   02/19/22 1057  BP: 114/62  Pulse: 79  Temp: 99.1 F (37.3 C)  TempSrc: Oral  SpO2: 100%  Weight: 126 lb 3.2 oz (57.2 kg)  Height: '5\' 7"'$  (1.702 m)     Physical Exam Constitutional:      General: She is not in acute distress.    Appearance: Normal appearance. She is well-developed.  HENT:     Head: Normocephalic and atraumatic.  Neck:     Comments: Prominent, possible slight enlarged left lobe of thyroid and isthmus,  no nodularity.  No carotid bruit or masses palpated.  No stridor.  No appreciable lymphadenopathy.  Pain-free range of motion of neck. Cardiovascular:     Rate and Rhythm: Normal rate.  Pulmonary:     Effort: Pulmonary effort is normal.     Breath sounds: Normal breath sounds. No stridor. No wheezing.  Lymphadenopathy:     Cervical: No cervical adenopathy.  Neurological:     Mental Status: She is alert and oriented to person, place, and time.  Psychiatric:        Mood and Affect: Mood normal.        Assessment & Plan:  April Russo is a 32 y.o. female . Thyroid enlargement - Plan: TSH, US THYROID As above, prominence of thyroid appreciated at her dentist, and I do appreciate possible enlargement of the left lobe, today.  Check imaging, repeat TSH, RTC precautions if new or worsening  symptoms.  Video on phone indicates probable spasm or twitching of the SCM muscle, reassuring exam at this time.  RTC precautions  No orders of the defined types were placed in this encounter.  Patient Instructions  The video likely indicated a spasm of the SCM muscle.  Exam was overall reassuring today.  Possible slight enlargement of the thyroid on the left as we discussed. I will check an ultrasound and repeat thyroid test today although that has been normal previously.  If any concerns on ultrasound we will let you know.   Please let me know if there are any new symptoms.  Take care!    Signed,   [name], MD [facility] 02/19/22 11:47 AM

## 2022-02-19 NOTE — Patient Instructions (Signed)
The video likely indicated a spasm of the SCM muscle.  Exam was overall reassuring today.  Possible slight enlargement of the thyroid on the left as we discussed. I will check an ultrasound and repeat thyroid test today although that has been normal previously.  If any concerns on ultrasound we will let you know.   Please let me know if there are any new symptoms.  Take care!

## 2022-02-24 ENCOUNTER — Ambulatory Visit
Admission: RE | Admit: 2022-02-24 | Discharge: 2022-02-24 | Disposition: A | Discharge status: Home / Self Care | Payer: 59 | Source: Ambulatory Visit | Attending: Family Medicine | Admitting: Family Medicine

## 2022-02-24 DIAGNOSIS — E049 Nontoxic goiter, unspecified: Secondary | ICD-10-CM

## 2022-05-24 LAB — HM MAMMOGRAPHY

## 2022-06-23 ENCOUNTER — Ambulatory Visit (INDEPENDENT_AMBULATORY_CARE_PROVIDER_SITE_OTHER): Payer: 59 | Admitting: Family Medicine

## 2022-06-23 VITALS — BP 110/70 | Ht 67.0 in | Wt 125.0 lb

## 2022-06-23 DIAGNOSIS — M25562 Pain in left knee: Secondary | ICD-10-CM | POA: Diagnosis not present

## 2022-06-24 ENCOUNTER — Encounter: Payer: Self-pay | Admitting: Family Medicine

## 2022-06-24 NOTE — Progress Notes (Signed)
PCP: Shade Flood, MD  Subjective:   HPI: Patient is a 32 y.o. female here for left knee pain.  Patient reports she's had pain posterior left knee dating back to the fall. She was training for a marathon at the time and couldn't complete it due to this pain. Pain is a soreness after running, limiting her to doing 3-4 miles at most now. No swelling or bruising. She's done 6 weeks of physical therapy and home exercises prescribed by orthopedics without much change. Also taken ibuprofen, rested. No catching, locking, giving out. Worse trying to extend knee fully.  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     triamcinolone cream (KENALOG) 0.1 % Apply 1 application topically 2 (two) times daily. As needed 30 g 1   No current facility-administered medications on file prior to visit.    Past Surgical History:  Procedure Laterality Date   wisdom tooth removal Bilateral     No Known Allergies  BP 110/70   Ht 5\' 7"  (1.702 m)   Wt 125 lb (56.7 kg)   BMI 19.58 kg/m       No data to display              No data to display              Objective:  Physical Exam:  Gen: NAD, comfortable in exam room  Left knee: No gross deformity, ecchymoses, swelling. TTP popliteal fossa.  No joint line, post patellar facet, hamstring, other tenderness. FROM with normal strength including resisted knee flexion at 30 and 90 degrees. Negative ant/post drawers. Negative valgus/varus testing. Negative lachman.  Negative mcmurrays, apleys, thessalys, bounce. NV intact distally.  Limited MSK u/s left knee:  No effusion.  No baker's cyst.   Medial and lateral hamstring tendons intact without abnormalities.  Venous structures compressible.  Doppler of PT and DP arteries normal after repetitive plantar and dorsiflexion.  Assessment & Plan:  1. Left knee pain - uncertain diagnosis.  Her exam is overall reassuring.  Completed 6 weeks of physical therapy ordered by a different provider and was diligent about her home exercises on days she did not go, took ibuprofen as well, rested, and still struggling with posterior knee pain.  Possible she has a meniscus tear on posterior aspect causing her symptoms - will proceed with MRI to assess.

## 2022-07-14 ENCOUNTER — Ambulatory Visit
Admission: RE | Admit: 2022-07-14 | Discharge: 2022-07-14 | Disposition: A | Discharge status: Home / Self Care | Payer: 59 | Source: Ambulatory Visit | Attending: Family Medicine | Admitting: Family Medicine

## 2022-07-14 DIAGNOSIS — M25562 Pain in left knee: Secondary | ICD-10-CM

## 2022-07-31 IMAGING — CR DG WRIST COMPLETE 3+V*R*
4 series · 4 of 4 positions shown · non-contrast
Comparison: None.

CLINICAL DATA: Recurrent proximal row pain for years pain over the
lunate

EXAM:
RIGHT WRIST - COMPLETE 3+ VIEW

[x wrist pa right]
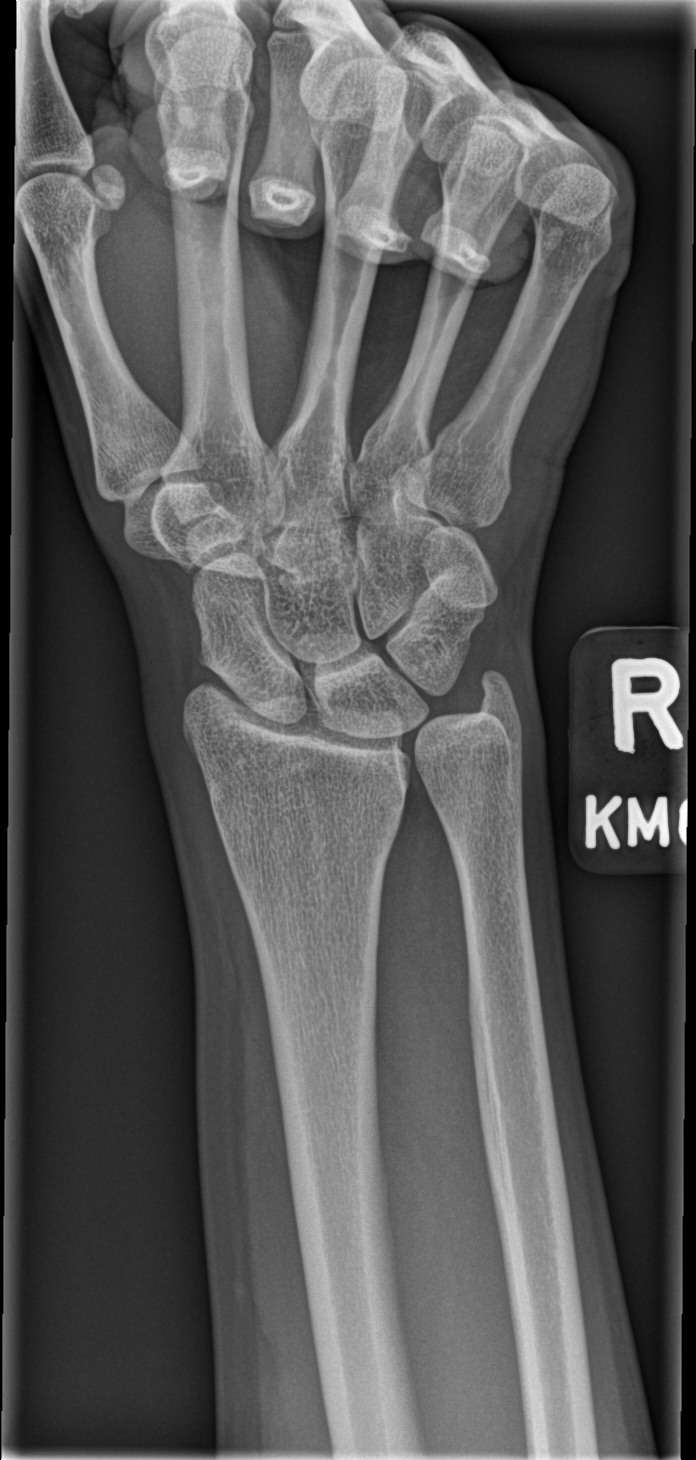

[x wrist obl right]
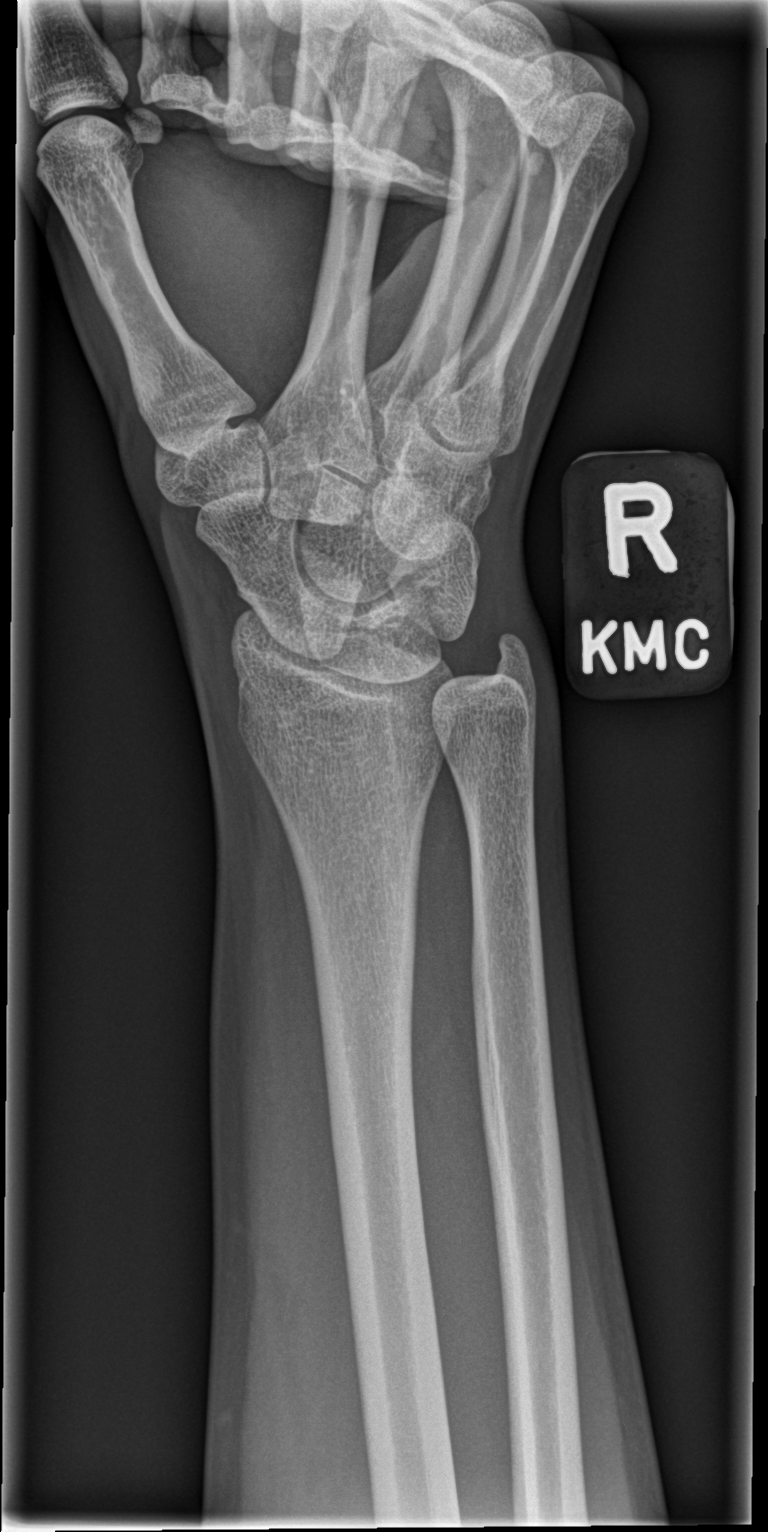

[x wrist lat right (1 of 2)]
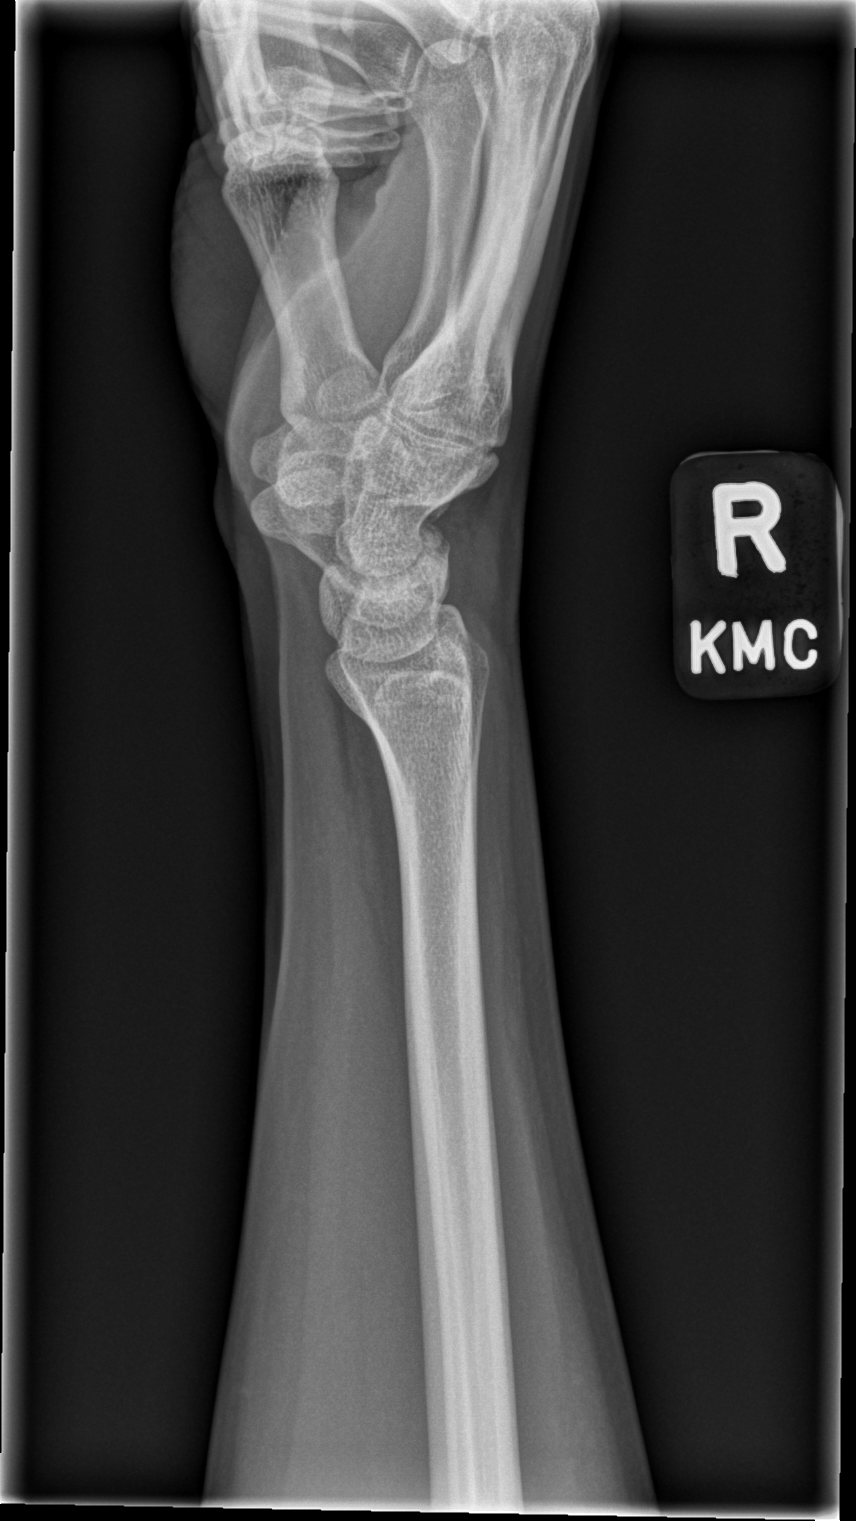

[x wrist lat right (2 of 2)]
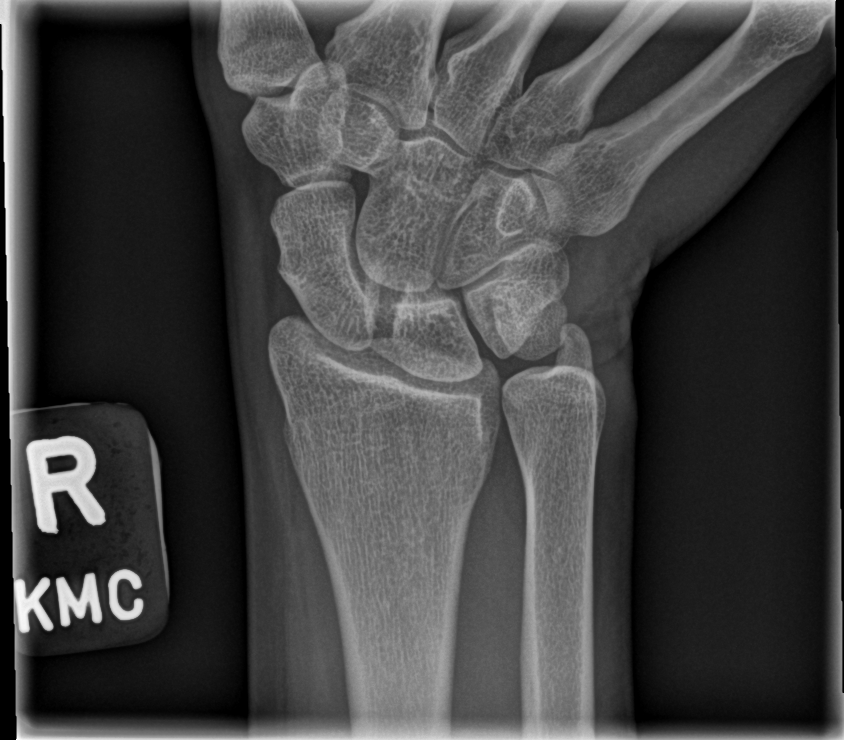

[4 of 4 positions shown; findings below may reference images not displayed]

FINDINGS: There is no evidence of fracture or dislocation. There is no
evidence of arthropathy or other focal bone abnormality. Soft
tissues are unremarkable.
IMPRESSION: Negative.

## 2022-08-05 ENCOUNTER — Ambulatory Visit: Payer: 59 | Admitting: Family Medicine

## 2022-08-18 ENCOUNTER — Ambulatory Visit (INDEPENDENT_AMBULATORY_CARE_PROVIDER_SITE_OTHER): Payer: 59 | Admitting: Sports Medicine

## 2022-08-18 VITALS — BP 96/62 | Ht 67.0 in | Wt 125.0 lb

## 2022-08-18 DIAGNOSIS — R269 Unspecified abnormalities of gait and mobility: Secondary | ICD-10-CM

## 2022-08-18 DIAGNOSIS — M79652 Pain in left thigh: Secondary | ICD-10-CM | POA: Diagnosis not present

## 2022-08-18 NOTE — Progress Notes (Signed)
Chief complaint left posterior knee pain  This patient has become a committed runner.  She was at her 20 mile run when she started having posterior knee pain.  She has been evaluated by Dr. Chilton Si and then by Dr. Pearletha Forge.  There was not a clear cause and she underwent 6 weeks of physical therapy without significant improvement.  An MRI done in June was normal so she restarted running.  She has been able to ease up to a lawn around 12 miles but continues to get some pain just posterior to the knee and slightly above in the posterior thigh.  No giving way No locking No swelling  Physical exam Thin athletic female in no acute distress BP 96/62   Ht 5\' 7"  (1.702 m)   Wt 125 lb (56.7 kg)   BMI 19.58 kg/m   Knee: Left Normal to inspection with no erythema or effusion or obvious bony abnormalities. Palpation normal with no warmth or joint line tenderness or patellar tenderness or condyle tenderness. ROM normal in flexion and extension and lower leg rotation. Ligaments with solid consistent endpoints including ACL, PCL, LCL, MCL. Negative Mcmurray's and provocative meniscal tests. Non painful patellar compression. Patellar and quadriceps tendons unremarkable. Hamstring and quadriceps strength is normal.  In spite of 6 weeks of PT she has marked hip abduction weakness on left and is strong on RT On palpation her pain is more over distal biceps femoris and not at the popliteal space  Running gait shows significant genu valgus on left that is dynamic with each step and none on RT

## 2022-08-18 NOTE — Assessment & Plan Note (Signed)
In addition to hip abduction exercises I gave her some extender exercises  I believe the dynamic motion causes excessive fatigue of the biceps femoris which tries to block internal rotation of the tibia That is likely why her pain only occurs late in long distance runs as she is not doing any structural damage but is getting more biomechanical stress

## 2022-08-18 NOTE — Assessment & Plan Note (Signed)
After correction with a scaphoid pad she did have less genu valgus Her feet are neutral For that reason I think a lot of this is coming from the hip abduction weakness in the gluteus medius Given the hip abduction series We need to check that again in 4 to 6 weeks I do not want her on her mileage until we get her biomechanics improved

## 2022-09-15 ENCOUNTER — Encounter (INDEPENDENT_AMBULATORY_CARE_PROVIDER_SITE_OTHER): Payer: Self-pay

## 2022-09-27 ENCOUNTER — Encounter: Payer: 59 | Admitting: Family Medicine

## 2022-09-30 ENCOUNTER — Ambulatory Visit (INDEPENDENT_AMBULATORY_CARE_PROVIDER_SITE_OTHER): Payer: 59 | Admitting: Family Medicine

## 2022-09-30 ENCOUNTER — Encounter: Payer: Self-pay | Admitting: Family Medicine

## 2022-09-30 VITALS — BP 118/60 | HR 83 | Temp 98.3°F | Ht 67.0 in | Wt 127.2 lb

## 2022-09-30 DIAGNOSIS — Z0184 Encounter for antibody response examination: Secondary | ICD-10-CM

## 2022-09-30 DIAGNOSIS — Z131 Encounter for screening for diabetes mellitus: Secondary | ICD-10-CM | POA: Diagnosis not present

## 2022-09-30 DIAGNOSIS — Z13220 Encounter for screening for lipoid disorders: Secondary | ICD-10-CM

## 2022-09-30 DIAGNOSIS — Z130 Encounter for screening for diseases of the blood and blood-forming organs and certain disorders involving the immune mechanism: Secondary | ICD-10-CM

## 2022-09-30 DIAGNOSIS — Z0000 Encounter for general adult medical examination without abnormal findings: Secondary | ICD-10-CM

## 2022-09-30 DIAGNOSIS — Z23 Encounter for immunization: Secondary | ICD-10-CM

## 2022-09-30 NOTE — Progress Notes (Signed)
Subjective:  Patient ID: April Russo, female    DOB: 12-Mar-1990  Age: 32 y.o. MRN: [id number]  CC:  Chief Complaint  Patient presents with   Annual Exam    Pt doing well, thinks she needs a Tdap, and asking about chicken pox booster?     HPI April Russo presents for Annual Exam Doing well. No new health concerns.   Followed by [facility] with increased risk of breast cancer (maternal aunt and sister), genetic testing in 2022 reportedly negative, negative BRCA.  Annual breast MRI in addition to annual mammogram.  Sports medicine, Dr. Pearletha Forge, Dr. Darrick Penna with left knee pain, gait abnormality, last visit July 3 with Dr. Darrick Penna.  Correction of a scaphoid pad, less genu valgus.  Left of hip abduction weakness in gluteus medius.  Plan for improved biomechanics prior to increasing mileage with running.  Prior MRI of left knee was reassuring.  GYN, Dr. Ellyn Hack, history of cyst of right ovary.  Contraception, Kyleena IUD. No plans for pregnancy in next year. No   Dermatology, Dr. Roderic Scarce, intermittent follow-up.  Thyroid ultrasound in January, moderately heterogeneous and mildly enlarged but no significant hypervascularity or discrete thyroid nodule. Rare SCM spasm.   Intermittent use of triamcinolone for hand dermatitis. Doing well.       09/30/2022    1:13 PM 02/19/2022   11:01 AM 09/23/2021    9:30 AM 09/23/2021    9:28 AM 09/18/2020    3:28 PM  Depression screen PHQ 2/9  Decreased Interest 0 0 0 0 0  Down, Depressed, Hopeless 0 0 0 0 0  PHQ - 2 Score 0 0 0 0 0  Altered sleeping 0 0 0  0  Tired, decreased energy 0 0 1  0  Change in appetite 0 0 0  0  Feeling bad or failure about yourself  0 0 0  0  Trouble concentrating 0 0 1  0  Moving slowly or fidgety/restless 0 0 0  0  Suicidal thoughts 0 0 0  0  PHQ-9 Score 0 0 2  0  Difficult doing work/chores     Not difficult at all    Health Maintenance  Topic Date Due   HIV Screening  Never done   Hepatitis C  Screening  Never done   DTaP/Tdap/Td (1 - Tdap) Never done   COVID-19 Vaccine (4 - 2023-24 season) 10/16/2021   INFLUENZA VACCINE  09/16/2022   PAP SMEAR-Modifier  08/26/2023   HPV VACCINES  Completed    Immunization History  Administered Date(s) Administered   HPV 9-valent 09/18/2020, 01/06/2021, 05/20/2021   Influenza-Unspecified 11/22/2019, 11/14/2020, 11/24/2021   PFIZER(Purple Top)SARS-COV-2 Vaccination 02/07/2019, 02/26/2019, 12/28/2019  Tdap due - today.  Multiple coworkers with shingles. Questions about varicella booster. Repeat booster in high school - titer was low.   No results found. Lasik in 2019.   Dental: Dr. Azucena Cecil, every 6 months.   Alcohol: 1-2 per month  Tobacco: none.   Exercise: running. [place] 9/8 in [place]. Has been doing exercises for hip abduction, mileage doing well, weightlifting past 5 months. Doing ok.    History Patient Active Problem List   Diagnosis Date Noted   Abnormality of gait 08/18/2022   Musculoskeletal thigh pain, left 08/18/2022   Genetic testing 10/21/2020   Family history of gene mutation 09/10/2020   Family history of breast cancer 09/10/2020   Family history of multiple myeloma 09/10/2020   Family history of bladder cancer 09/10/2020  Influenza 02/03/2018   Flu-like symptoms 02/03/2018   Sore throat 02/03/2018   Sinus congestion 02/03/2018   Past Medical History:  Diagnosis Date   Depression    Family history of bladder cancer    Family history of breast cancer    Family history of gene mutation    BRCA1 c.81-1G>A   Family history of multiple myeloma    Past Surgical History:  Procedure Laterality Date   wisdom tooth removal Bilateral    No Known Allergies Prior to Admission medications   Medication Sig Start Date End Date Taking? Authorizing Provider  cetirizine (ZYRTEC) 10 MG tablet Take 10 mg by mouth as needed for allergies.   Yes [provider]  triamcinolone cream (KENALOG) 0.1  % Apply 1 application topically 2 (two) times daily. As needed 09/18/20  Yes Shade Flood, MD   Social History   Socioeconomic History   Marital status: Married    Spouse name: Not on file   Number of children: Not on file   Years of education: Not on file   Highest education level: Not on file  Occupational History   Not on file  Tobacco Use   Smoking status: Never   Smokeless tobacco: Never  Substance and Sexual Activity   Alcohol use: Yes    Comment: Socal    Drug use: Never   Sexual activity: Yes  Other Topics Concern   Not on file  Social History Narrative   Not on file   Social Determinants of Health   Financial Resource Strain: Not on file  Food Insecurity: Not on file  Transportation Needs: Not on file  Physical Activity: Not on file  Stress: Not on file  Social Connections: Not on file  Intimate Partner Violence: Not on file    Review of Systems 13 point review of systems per patient health survey noted.  Negative other than as indicated above or in HPI.   Objective:   Vitals:   09/30/22 1315  BP: 118/60  Pulse: 83  Temp: 98.3 F (36.8 C)  TempSrc: Temporal  SpO2: 100%  Weight: 127 lb 3.2 oz (57.7 kg)  Height: 5\' 7"  (1.702 m)     Physical Exam Vitals reviewed.  Constitutional:      Appearance: She is well-developed.  HENT:     Head: Normocephalic and atraumatic.     Right Ear: External ear normal.     Left Ear: External ear normal.  Eyes:     Conjunctiva/sclera: Conjunctivae normal.     Pupils: Pupils are equal, round, and reactive to light.  Neck:     Thyroid: No thyromegaly.  Cardiovascular:     Rate and Rhythm: Normal rate and regular rhythm.     Heart sounds: Normal heart sounds. No murmur heard. Pulmonary:     Effort: Pulmonary effort is normal. No respiratory distress.     Breath sounds: Normal breath sounds. No wheezing.  Abdominal:     General: Bowel sounds are normal.     Palpations: Abdomen is soft.     Tenderness:  There is no abdominal tenderness.  Musculoskeletal:        General: No tenderness. Normal range of motion.     Cervical back: Normal range of motion and neck supple.  Lymphadenopathy:     Cervical: No cervical adenopathy.  Skin:    General: Skin is warm and dry.     Findings: No rash.  Neurological:     Mental Status: She is alert  and oriented to person, place, and time.  Psychiatric:        Behavior: Behavior normal.        Thought Content: Thought content normal.     Assessment & Plan:  April Russo is a 32 y.o. female . Annual physical exam  - -anticipatory guidance as below in AVS, screening labs above. Health maintenance items as above in HPI discussed/recommended as applicable.   Screening for diabetes mellitus - Plan: Comprehensive metabolic panel, Hemoglobin A1c  Screening for hyperlipidemia - Plan: Comprehensive metabolic panel, Lipid panel  Screening for deficiency anemia - Plan: CBC with Differential/Platelet  Immunity to varicella determined by serologic test - Plan: Varicella zoster antibody, IgG  -Previously required varicella booster, check titer as above then determine if need for repeat vaccination.  Need for diphtheria-tetanus-pertussis (Tdap) vaccine - Plan: Tdap vaccine greater than or equal to 7yo IM  -Tdap updated.  No orders of the defined types were placed in this encounter.  Patient Instructions  No concerns on your exam today.  I will check some labs as we discussed including the varicella titer and if that is low can order repeat varicella vaccine.  Good luck with the upcoming marathon and let me know if there are any questions.  Preventive Care 48-31 Years Old, Female Preventive care refers to lifestyle choices and visits with your health care provider that can promote health and wellness. Preventive care visits are also called wellness exams. What can I expect for my preventive care visit? Counseling During your preventive care visit, your  health care provider may ask about your: Medical history, including: Past medical problems. Family medical history. Pregnancy history. Current health, including: Menstrual cycle. Method of birth control. Emotional well-being. Home life and relationship well-being. Sexual activity and sexual health. Lifestyle, including: Alcohol, nicotine or tobacco, and drug use. Access to firearms. Diet, exercise, and sleep habits. Work and work Astronomer. Sunscreen use. Safety issues such as seatbelt and bike helmet use. Physical exam Your health care provider may check your: Height and weight. These may be used to calculate your BMI (body mass index). BMI is a measurement that tells if you are at a healthy weight. Waist circumference. This measures the distance around your waistline. This measurement also tells if you are at a healthy weight and may help predict your risk of certain diseases, such as type 2 diabetes and high blood pressure. Heart rate and blood pressure. Body temperature. Skin for abnormal spots. What immunizations do I need?  Vaccines are usually given at various ages, according to a schedule. Your health care provider will recommend vaccines for you based on your age, medical history, and lifestyle or other factors, such as travel or where you work. What tests do I need? Screening Your health care provider may recommend screening tests for certain conditions. This may include: Pelvic exam and Pap test. Lipid and cholesterol levels. Diabetes screening. This is done by checking your blood sugar (glucose) after you have not eaten for a while (fasting). Hepatitis B test. Hepatitis C test. HIV (human immunodeficiency virus) test. STI (sexually transmitted infection) testing, if you are at risk. BRCA-related cancer screening. This may be done if you have a family history of breast, ovarian, tubal, or peritoneal cancers. Talk with your health care provider about your test  results, treatment options, and if necessary, the need for more tests. Follow these instructions at home: Eating and drinking  Eat a healthy diet that includes fresh fruits and vegetables, whole grains, lean  protein, and low-fat dairy products. Take vitamin and mineral supplements as recommended by your health care provider. Do not drink alcohol if: Your health care provider tells you not to drink. You are pregnant, may be pregnant, or are planning to become pregnant. If you drink alcohol: Limit how much you have to 0-1 drink a day. Know how much alcohol is in your drink. In the [place] one drink equals one 12 oz bottle of beer (355 mL), one 5 oz glass of wine (148 mL), or one 1 oz glass of hard liquor (44 mL). Lifestyle Brush your teeth every morning and night with fluoride toothpaste. Floss one time each day. Exercise for at least 30 minutes 5 or more days each week. Do not use any products that contain nicotine or tobacco. These products include cigarettes, chewing tobacco, and vaping devices, such as e-cigarettes. If you need help quitting, ask your health care provider. Do not use drugs. If you are sexually active, practice safe sex. Use a condom or other form of protection to prevent STIs. If you do not wish to become pregnant, use a form of birth control. If you plan to become pregnant, see your health care provider for a prepregnancy visit. Find healthy ways to manage stress, such as: Meditation, yoga, or listening to music. Journaling. Talking to a trusted person. Spending time with friends and family. Minimize exposure to UV radiation to reduce your risk of skin cancer. Safety Always wear your seat belt while driving or riding in a vehicle. Do not drive: If you have been drinking alcohol. Do not ride with someone who has been drinking. If you have been using any mind-altering substances or drugs. While texting. When you are tired or distracted. Wear a helmet and other  protective equipment during sports activities. If you have firearms in your house, make sure you follow all gun safety procedures. Seek help if you have been physically or sexually abused. What's next? Go to your health care provider once a year for an annual wellness visit. Ask your health care provider how often you should have your eyes and teeth checked. Stay up to date on all vaccines. This information is not intended to replace advice given to you by your health care provider. Make sure you discuss any questions you have with your health care provider. Document Revised: 07/30/2020 Document Reviewed: 07/30/2020 Elsevier Patient Education  2024 Elsevier Inc.     Signed,   Meredith Staggers, MD [facility], [facility] 09/30/22 1:46 PM

## 2022-09-30 NOTE — Patient Instructions (Signed)
No concerns on your exam today.  I will check some labs as we discussed including the varicella titer and if that is low can order repeat varicella vaccine.  Good luck with the upcoming marathon and let me know if there are any questions.  Preventive Care 77-31 Years Old, Female Preventive care refers to lifestyle choices and visits with your health care provider that can promote health and wellness. Preventive care visits are also called wellness exams. What can I expect for my preventive care visit? Counseling During your preventive care visit, your health care provider may ask about your: Medical history, including: Past medical problems. Family medical history. Pregnancy history. Current health, including: Menstrual cycle. Method of birth control. Emotional well-being. Home life and relationship well-being. Sexual activity and sexual health. Lifestyle, including: Alcohol, nicotine or tobacco, and drug use. Access to firearms. Diet, exercise, and sleep habits. Work and work Astronomer. Sunscreen use. Safety issues such as seatbelt and bike helmet use. Physical exam Your health care provider may check your: Height and weight. These may be used to calculate your BMI (body mass index). BMI is a measurement that tells if you are at a healthy weight. Waist circumference. This measures the distance around your waistline. This measurement also tells if you are at a healthy weight and may help predict your risk of certain diseases, such as type 2 diabetes and high blood pressure. Heart rate and blood pressure. Body temperature. Skin for abnormal spots. What immunizations do I need?  Vaccines are usually given at various ages, according to a schedule. Your health care provider will recommend vaccines for you based on your age, medical history, and lifestyle or other factors, such as travel or where you work. What tests do I need? Screening Your health care provider may recommend  screening tests for certain conditions. This may include: Pelvic exam and Pap test. Lipid and cholesterol levels. Diabetes screening. This is done by checking your blood sugar (glucose) after you have not eaten for a while (fasting). Hepatitis B test. Hepatitis C test. HIV (human immunodeficiency virus) test. STI (sexually transmitted infection) testing, if you are at risk. BRCA-related cancer screening. This may be done if you have a family history of breast, ovarian, tubal, or peritoneal cancers. Talk with your health care provider about your test results, treatment options, and if necessary, the need for more tests. Follow these instructions at home: Eating and drinking  Eat a healthy diet that includes fresh fruits and vegetables, whole grains, lean protein, and low-fat dairy products. Take vitamin and mineral supplements as recommended by your health care provider. Do not drink alcohol if: Your health care provider tells you not to drink. You are pregnant, may be pregnant, or are planning to become pregnant. If you drink alcohol: Limit how much you have to 0-1 drink a day. Know how much alcohol is in your drink. In the [place] one drink equals one 12 oz bottle of beer (355 mL), one 5 oz glass of wine (148 mL), or one 1 oz glass of hard liquor (44 mL). Lifestyle Brush your teeth every morning and night with fluoride toothpaste. Floss one time each day. Exercise for at least 30 minutes 5 or more days each week. Do not use any products that contain nicotine or tobacco. These products include cigarettes, chewing tobacco, and vaping devices, such as e-cigarettes. If you need help quitting, ask your health care provider. Do not use drugs. If you are sexually active, practice safe sex. Use a condom  or other form of protection to prevent STIs. If you do not wish to become pregnant, use a form of birth control. If you plan to become pregnant, see your health care provider for a prepregnancy  visit. Find healthy ways to manage stress, such as: Meditation, yoga, or listening to music. Journaling. Talking to a trusted person. Spending time with friends and family. Minimize exposure to UV radiation to reduce your risk of skin cancer. Safety Always wear your seat belt while driving or riding in a vehicle. Do not drive: If you have been drinking alcohol. Do not ride with someone who has been drinking. If you have been using any mind-altering substances or drugs. While texting. When you are tired or distracted. Wear a helmet and other protective equipment during sports activities. If you have firearms in your house, make sure you follow all gun safety procedures. Seek help if you have been physically or sexually abused. What's next? Go to your health care provider once a year for an annual wellness visit. Ask your health care provider how often you should have your eyes and teeth checked. Stay up to date on all vaccines. This information is not intended to replace advice given to you by your health care provider. Make sure you discuss any questions you have with your health care provider. Document Revised: 07/30/2020 Document Reviewed: 07/30/2020 Elsevier Patient Education  2024 [employer].

## 2022-10-01 LAB — CBC WITH DIFFERENTIAL/PLATELET
Basophils Absolute: 0 10*3/uL (ref 0.0–0.1)
Basophils Relative: 0.6 % (ref 0.0–3.0)
Eosinophils Absolute: 0.2 10*3/uL (ref 0.0–0.7)
Eosinophils Relative: 3.2 % (ref 0.0–5.0)
HCT: 43.3 % (ref 36.0–46.0)
Hemoglobin: 14.3 g/dL (ref 12.0–15.0)
Lymphocytes Relative: 30.2 % (ref 12.0–46.0)
Lymphs Abs: 1.9 10*3/uL (ref 0.7–4.0)
MCHC: 33 g/dL (ref 30.0–36.0)
MCV: 87.3 fl (ref 78.0–100.0)
Monocytes Absolute: 0.5 10*3/uL (ref 0.1–1.0)
Monocytes Relative: 8.6 % (ref 3.0–12.0)
Neutro Abs: 3.6 10*3/uL (ref 1.4–7.7)
Neutrophils Relative %: 57.4 % (ref 43.0–77.0)
Platelets: 210 10*3/uL (ref 150.0–400.0)
RBC: 4.96 Mil/uL (ref 3.87–5.11)
RDW: 12.4 % (ref 11.5–15.5)
WBC: 6.3 10*3/uL (ref 4.0–10.5)

## 2022-10-01 LAB — LIPID PANEL
Cholesterol: 194 mg/dL (ref 0–200)
HDL: 74.5 mg/dL (ref >39.00)
LDL Cholesterol: 109 mg/dL — ABNORMAL HIGH (ref 0–99)
NonHDL: 119.36
Total CHOL/HDL Ratio: 3
Triglycerides: 52 mg/dL (ref 0.0–149.0)
VLDL: 10.4 mg/dL (ref 0.0–40.0)

## 2022-10-01 LAB — COMPREHENSIVE METABOLIC PANEL
ALT: 19 U/L (ref 0–35)
AST: 21 U/L (ref 0–37)
Albumin: 5 g/dL (ref 3.5–5.2)
Alkaline Phosphatase: 47 U/L (ref 39–117)
BUN: 14 mg/dL (ref 6–23)
CO2: 26 meq/L (ref 19–32)
Calcium: 10 mg/dL (ref 8.4–10.5)
Chloride: 98 meq/L (ref 96–112)
Creatinine, Ser: 0.87 mg/dL (ref 0.40–1.20)
GFR: 89.23 mL/min (ref >60.00)
Glucose, Bld: 83 mg/dL (ref 70–99)
Potassium: 3.8 meq/L (ref 3.5–5.1)
Sodium: 133 meq/L — ABNORMAL LOW (ref 135–145)
Total Bilirubin: 1.2 mg/dL (ref 0.2–1.2)
Total Protein: 7.7 g/dL (ref 6.0–8.3)

## 2022-10-01 LAB — VARICELLA ZOSTER ANTIBODY, IGG: Varicella IgG: 339.8 {index}

## 2022-10-04 LAB — HEMOGLOBIN A1C: Hgb A1c MFr Bld: 5.6 % (ref 4.6–6.5)

## 2022-10-05 ENCOUNTER — Ambulatory Visit: Payer: 59 | Admitting: Sports Medicine

## 2023-02-15 ENCOUNTER — Encounter: Payer: Self-pay | Admitting: Family Medicine

## 2023-02-15 ENCOUNTER — Ambulatory Visit: Payer: 59 | Admitting: Family Medicine

## 2023-02-15 ENCOUNTER — Ambulatory Visit: Payer: Self-pay | Admitting: Family Medicine

## 2023-02-15 ENCOUNTER — Ambulatory Visit (INDEPENDENT_AMBULATORY_CARE_PROVIDER_SITE_OTHER): Payer: 59 | Admitting: Family Medicine

## 2023-02-15 VITALS — BP 119/76 | HR 104 | Temp 98.5°F | Resp 18 | Ht 67.0 in | Wt 129.6 lb

## 2023-02-15 DIAGNOSIS — R051 Acute cough: Secondary | ICD-10-CM | POA: Diagnosis not present

## 2023-02-15 DIAGNOSIS — R509 Fever, unspecified: Secondary | ICD-10-CM | POA: Diagnosis not present

## 2023-02-15 LAB — POCT INFLUENZA A/B
Influenza A, POC: NEGATIVE
Influenza B, POC: NEGATIVE

## 2023-02-15 MED ORDER — ALBUTEROL SULFATE HFA 108 (90 BASE) MCG/ACT IN AERS: 2.0000 | INHALATION_SPRAY | Freq: Four times a day (QID) | RESPIRATORY_TRACT | 0 refills | Status: DC | PRN

## 2023-02-15 NOTE — Progress Notes (Signed)
 New Patient Office Visit  Subjective    Patient ID: April Russo, female    DOB: [birth year]  Age: 32 y.o. MRN: [id number]  CC:  Chief Complaint  Patient presents with   URI    Patient states that her symptoms started last night with cough, body aches, and a temperature of 100. Patient states that she took Advil today for her temperature.    HPI April Russo presents with acute visit. Pt is new to me.   URI Pt reports symptoms for the last 2 weeks with cough and laryngitis symptoms. She was diagnosed with bronchitis and given Albuterol  inhaler. She requests refills on this.  She reports she was feeling better until last night, she had body aches and temperature of 100. Denies chest pains or SOB. She does have some wheezing. No ear pain but with sore throat. She has been doing Mucinex  for cough which has helped along with Sudafed that also helps.    Outpatient Encounter Medications as of 02/15/2023  Medication Sig   albuterol  (VENTOLIN  HFA) 108 (90 Base) MCG/ACT inhaler Inhale 2 puffs into the lungs every 6 (six) hours as needed for wheezing or shortness of breath.   cetirizine (ZYRTEC) 10 MG tablet Take 10 mg by mouth as needed for allergies.   triamcinolone  cream (KENALOG ) 0.1 % Apply 1 application topically 2 (two) times daily. As needed   No facility-administered encounter medications on file as of 02/15/2023.    Past Medical History:  Diagnosis Date   Depression    Family history of bladder cancer    Family history of breast cancer    Family history of gene mutation    BRCA1 c.81-1G>A   Family history of multiple myeloma     Past Surgical History:  Procedure Laterality Date   wisdom tooth removal Bilateral     Family History  Problem Relation Age of Onset   Breast cancer Sister    Stroke Sister 34   Breast cancer Maternal Aunt 50       both breasts, b/l mastectomies, ovaries removed   BRCA 1/2 Maternal Aunt        BRCA1 c.81-1G>A   Other Maternal Uncle         BRCA1 negative   Emphysema Maternal Grandmother    Multiple myeloma Paternal Grandmother 64   Heart disease Paternal Grandfather    Bladder Cancer Paternal Grandfather 58   Breast cancer Other        maternal great-aunt (MGF's sister)   Cancer Other        multiple of her mother's cousins    Social History   Socioeconomic History   Marital status: Married    Spouse name: Not on file   Number of children: Not on file   Years of education: Not on file   Highest education level: Not on file  Occupational History   Not on file  Tobacco Use   Smoking status: Never   Smokeless tobacco: Never  Substance and Sexual Activity   Alcohol use: Yes    Comment: Socal    Drug use: Never   Sexual activity: Yes  Other Topics Concern   Not on file  Social History Narrative   Not on file   Social Drivers of Health   Financial Resource Strain: Not on file  Food Insecurity: Not on file  Transportation Needs: Not on file  Physical Activity: Not on file  Stress: Not on file  Social Connections: Not on file  Intimate  Partner Violence: Not on file    Review of Systems  Constitutional:  Positive for fever.  Respiratory:  Positive for cough.   All other systems reviewed and are negative.      Objective    BP 119/76   Pulse (!) 104   Temp 98.5 F (36.9 C) (Oral)   Resp 18   Ht 5' 7 (1.702 m)   Wt 129 lb 9.6 oz (58.8 kg)   SpO2 99%   BMI 20.30 kg/m   Physical Exam Vitals and nursing note reviewed.  Constitutional:      Appearance: Normal appearance. She is normal weight.  HENT:     Head: Normocephalic and atraumatic.     Right Ear: External ear normal.     Left Ear: External ear normal.     Nose: Nose normal.     Mouth/Throat:     Mouth: Mucous membranes are moist.     Pharynx: Oropharynx is clear.  Eyes:     Conjunctiva/sclera: Conjunctivae normal.     Pupils: Pupils are equal, round, and reactive to light.  Cardiovascular:     Rate and Rhythm: Regular rhythm.  Tachycardia present.     Pulses: Normal pulses.     Heart sounds: Normal heart sounds.  Pulmonary:     Effort: Pulmonary effort is normal.     Breath sounds: Normal breath sounds.  Skin:    General: Skin is warm.     Capillary Refill: Capillary refill takes less than 2 seconds.  Neurological:     General: No focal deficit present.     Mental Status: She is alert and oriented to person, place, and time. Mental status is at baseline.  Psychiatric:        Mood and Affect: Mood normal.        Behavior: Behavior normal.        Thought Content: Thought content normal.        Judgment: Judgment normal.       Assessment & Plan:   Problem List Items Addressed This Visit   None Visit Diagnoses       Fever, unspecified fever cause    -  Primary   Relevant Medications   albuterol  (VENTOLIN  HFA) 108 (90 Base) MCG/ACT inhaler   Other Relevant Orders   POCT Influenza A/B (Completed)     Acute cough       Relevant Medications   albuterol  (VENTOLIN  HFA) 108 (90 Base) MCG/ACT inhaler      Fever, unspecified fever cause -     POCT Influenza A/B -     Albuterol  Sulfate HFA; Inhale 2 puffs into the lungs every 6 (six) hours as needed for wheezing or shortness of breath.  Dispense: 8 g; Refill: 0  Acute cough -     Albuterol  Sulfate HFA; Inhale 2 puffs into the lungs every 6 (six) hours as needed for wheezing or shortness of breath.  Dispense: 8 g; Refill: 0   Flu negative. Unable to perform covid swab. Likely viral URI. Symptomatic care with continued Mucinex  and may add honey, tea, vicks rub. To refill Albuterol  inhaler today. No follow-ups on file.   Torrence CINDERELLA Barrier, MD

## 2023-02-15 NOTE — Telephone Encounter (Signed)
 Copied from CRM [id number]. Topic: Clinical - Red Word Triage >> Feb 15, 2023  9:31 AM Leila C wrote: Red Word that prompted transfer to Nurse Triage: Patient thinks she may have Flu or Covid, fever 101, coughing and body aches and pain started last night. Patient is trying to make an appointment to be seen today, and there's no appointment available. Pls advise [phone]   Chief Complaint: non productive cough  Symptoms: body aches temperature of 100 Frequency: body aches started 02/14/23 cough ongoing Pertinent Negatives: Patient denies shortness of breath  Disposition: [] ED /[] Urgent Care (no appt availability in office) / [x] Appointment(In office/virtual)/ []  [facility]/ [] Home Care/ [] Refused Recommended Disposition /[]  Mobile Bus/ []  Follow-up with PCP Additional Notes: The patient reported that she was sick a couple of weeks ago but was never seen at the doctor.  Her cough has been ongoing for a couple of weeks but she had a new onset of body aches and a temperature of 100 beginning 02/14/23.  She requested a flu or Covid swab.  She was around several people who were coughing on Christmas.  She was scheduled for a same day appointment at a different office due to no availability at her normal doctor's office.   Reason for Disposition  [1] Continuous (nonstop) coughing interferes with work or school AND [2] no improvement using cough treatment per Care Advice  Answer Assessment - Initial Assessment Questions 1. ONSET: When did the cough begin?      02/14/2023 2. SEVERITY: How bad is the cough today?      Constant throughout the day  3. SPUTUM: Describe the color of your sputum (none, dry cough; clear, white, yellow, green)     none 4. HEMOPTYSIS: Are you coughing up any blood? If so ask: How much? (flecks, streaks, tablespoons, etc.)     none 5. DIFFICULTY BREATHING: Are you having difficulty breathing? If Yes, ask: How bad is it? (e.g., mild,  moderate, severe)    - MILD: No SOB at rest, mild SOB with walking, speaks normally in sentences, can lie down, no retractions, pulse < 100.    - MODERATE: SOB at rest, SOB with minimal exertion and prefers to sit, cannot lie down flat, speaks in phrases, mild retractions, audible wheezing, pulse 100-120.    - SEVERE: Very SOB at rest, speaks in single words, struggling to breathe, sitting hunched forward, retractions, pulse > 120      none 6. FEVER: Do you have a fever? If Yes, ask: What is your temperature, how was it measured, and when did it start?     101 this morning  7. CARDIAC HISTORY: Do you have any history of heart disease? (e.g., heart attack, congestive heart failure)      none 8. LUNG HISTORY: Do you have any history of lung disease?  (e.g., pulmonary embolus, asthma, emphysema)     none 10. OTHER SYMPTOMS: Do you have any other symptoms? (e.g., runny nose, wheezing, chest pain)       Body aches  A little runny nose  Been around people who were coughing  Protocols used: Cough - Acute Non-Productive-A-AH

## 2023-02-15 NOTE — Telephone Encounter (Signed)
FYI pt being seen at another practice today

## 2023-10-03 ENCOUNTER — Encounter: Payer: 59 | Admitting: Family Medicine

## 2023-10-05 ENCOUNTER — Ambulatory Visit (INDEPENDENT_AMBULATORY_CARE_PROVIDER_SITE_OTHER): Admitting: Family Medicine

## 2023-10-05 ENCOUNTER — Encounter: Payer: Self-pay | Admitting: Family Medicine

## 2023-10-05 VITALS — BP 98/60 | HR 77 | Temp 98.0°F | Ht 67.25 in | Wt 131.6 lb

## 2023-10-05 DIAGNOSIS — Z131 Encounter for screening for diabetes mellitus: Secondary | ICD-10-CM | POA: Diagnosis not present

## 2023-10-05 DIAGNOSIS — Z13220 Encounter for screening for lipoid disorders: Secondary | ICD-10-CM

## 2023-10-05 DIAGNOSIS — L309 Dermatitis, unspecified: Secondary | ICD-10-CM

## 2023-10-05 DIAGNOSIS — Z130 Encounter for screening for diseases of the blood and blood-forming organs and certain disorders involving the immune mechanism: Secondary | ICD-10-CM

## 2023-10-05 DIAGNOSIS — E049 Nontoxic goiter, unspecified: Secondary | ICD-10-CM | POA: Diagnosis not present

## 2023-10-05 DIAGNOSIS — Z0000 Encounter for general adult medical examination without abnormal findings: Secondary | ICD-10-CM | POA: Diagnosis not present

## 2023-10-05 DIAGNOSIS — Z1159 Encounter for screening for other viral diseases: Secondary | ICD-10-CM

## 2023-10-05 MED ORDER — TRIAMCINOLONE ACETONIDE 0.1 % EX CREA: 1.0000 | TOPICAL_CREAM | Freq: Two times a day (BID) | CUTANEOUS | 1 refills | Status: AC

## 2023-10-05 NOTE — Progress Notes (Signed)
 Subjective:  Patient ID: April Russo, female    DOB: Jul 01, 1990  Age: 33 y.o. MRN: [id number]  CC:  Chief Complaint  Patient presents with   Annual Exam    Pt is well, no concerns, pt is fasting     HPI April Russo presents for Annual Exam PCP, me GYN, Dr. Rolan, Josette IUD for contraception, visit with GYN earlier today.  No bloodwork, declined sti screening.  Family history of breast cancer in first-degree relative, Note reviewed from 09/27/2023.  Tyrer-Cuzick score of 30.9% lifetime risk of breast cancer, meeting NCCN criteria for high risk.  Annual breast MRI in addition to annual mammogram.  Previous genetic testing in 2022 were negative.  Option of further risk reduction with tamoxifen or risk reducing surgery, did not recommend risk reduction with tamoxifen until least 31 years of age.  Was not ready for bilateral prophylactic mastectomy.  Plan for annual MRI in addition to annual mammogram.  Mammogram April 2026, MRI October 2025.  She had met with plastic surgery to discuss prophylactic mastectomies but not interested at this time. Dermatology, Dr. Bonney with intermittent follow-up. Sports medicine, Dr. Cleatrice, Dr. Harvey previously with left knee pain, gait abnormality , scaphoid pad correcting some of the genu valgus.  Improved biomechanics planned prior to increase mileage with running.   discussed last year. Less running - few miles.   History of hand dermatitis, intermittent use of triamcinolone  as needed. Overall doing well. Uses for few episodes in winter - daily for 4 days.  Lab Results  Component Value Date   CHOL 194 09/30/2022   HDL 74.50 09/30/2022   LDLCALC 109 (H) 09/30/2022   TRIG 52.0 09/30/2022   CHOLHDL 3 09/30/2022   Lab Results  Component Value Date   HGBA1C 5.6 09/30/2022    Ultrasound of thyroid  last January with moderately heterogeneous and mildly enlarged but no significant hypervascularity or discrete thyroid  nodule. Lab Results  Component  Value Date   TSH 2.61 02/19/2022  Sister with hypothyroidism.   No new hot or cold intolerance. No new hair or skin changes, heart palpitations or new fatigue. No new acute. weight changes.  Wt Readings from Last 3 Encounters:  10/05/23 131 lb 9.6 oz (59.7 kg)  02/15/23 129 lb 9.6 oz (58.8 kg)  09/30/22 127 lb 3.2 oz (57.7 kg)       09/30/2022    1:13 PM 02/19/2022   11:01 AM 09/23/2021    9:30 AM 09/23/2021    9:28 AM 09/18/2020    3:28 PM  Depression screen PHQ 2/9  Decreased Interest 0 0 0 0 0  Down, Depressed, Hopeless 0 0 0 0 0  PHQ - 2 Score 0 0 0 0 0  Altered sleeping 0 0 0  0  Tired, decreased energy 0 0 1  0  Change in appetite 0 0 0  0  Feeling bad or failure about yourself  0 0 0  0  Trouble concentrating 0 0 1  0  Moving slowly or fidgety/restless 0 0 0  0  Suicidal thoughts 0 0 0  0  PHQ-9 Score 0 0 2  0  Difficult doing work/chores     Not difficult at all    Health Maintenance  Topic Date Due   HIV Screening  Never done   Hepatitis C Screening  Never done   Hepatitis B Vaccines 19-59 Average Risk (1 of 3 - 19+ 3-dose series) Never done   COVID-19 Vaccine (5 - 2024-25  season) 10/17/2022   Cervical Cancer Screening (HPV/Pap Cotest)  08/26/2023   INFLUENZA VACCINE  09/16/2023   DTaP/Tdap/Td (2 - Td or Tdap) 09/29/2032   HPV VACCINES  Completed   Pneumococcal Vaccine  Aged Out   Meningococcal B Vaccine  Aged Out    Immunization History  Administered Date(s) Administered   HPV 9-valent 09/18/2020, 01/06/2021, 05/20/2021   Influenza-Unspecified 11/22/2019, 11/14/2020, 11/24/2021   PFIZER(Purple Top)SARS-COV-2 Vaccination 02/07/2019, 02/26/2019, 12/28/2019   Tdap 09/30/2022   Hep c testing, Hep b titer today.   No results found. No corrective lenses. Lasik few years ago - plans visit.   Dental: every 6 months.   Alcohol: 4 per month.  Tobacco: none.   Exercise: 2 days per week - 30-40 min. Less running.   History Patient Active Problem List    Diagnosis Date Noted   Abnormality of gait 08/18/2022   Musculoskeletal thigh pain, left 08/18/2022   Genetic testing 10/21/2020   Family history of gene mutation 09/10/2020   Family history of breast cancer 09/10/2020   Family history of multiple myeloma 09/10/2020   Family history of bladder cancer 09/10/2020   Influenza 02/03/2018   Flu-like symptoms 02/03/2018   Sore throat 02/03/2018   Sinus congestion 02/03/2018   Past Medical History:  Diagnosis Date   Depression    Family history of bladder cancer    Family history of breast cancer    Family history of gene mutation    BRCA1 c.81-1G>A   Family history of multiple myeloma    Past Surgical History:  Procedure Laterality Date   wisdom tooth removal Bilateral    No Known Allergies Prior to Admission medications   Medication Sig Start Date End Date Taking? Authorizing Provider  cetirizine (ZYRTEC) 10 MG tablet Take 10 mg by mouth as needed for allergies.   Yes [provider]  levonorgestrel (KYLEENA) 19.5 MG IUD 19.5 mg by Intrauterine route.   Yes [provider]  triamcinolone  cream (KENALOG ) 0.1 % Apply 1 application topically 2 (two) times daily. As needed 09/18/20  Yes Levora Reyes SAUNDERS, MD  albuterol  (VENTOLIN  HFA) 108 [phone] Base) MCG/ACT inhaler Inhale 2 puffs into the lungs every 6 (six) hours as needed for wheezing or shortness of breath. Patient not taking: Reported on 10/05/2023 02/15/23   Colette Torrence GRADE, MD   Social History   Socioeconomic History   Marital status: Married    Spouse name: Not on file   Number of children: Not on file   Years of education: Not on file   Highest education level: Not on file  Occupational History   Not on file  Tobacco Use   Smoking status: Never   Smokeless tobacco: Never  Substance and Sexual Activity   Alcohol use: Yes    Comment: [place]    Drug use: Never   Sexual activity: Yes  Other Topics Concern   Not on file  Social History Narrative   Not  on file   Social Drivers of Health   Financial Resource Strain: Not on file  Food Insecurity: Not on file  Transportation Needs: Not on file  Physical Activity: Not on file  Stress: Not on file  Social Connections: Not on file  Intimate Partner Violence: Not on file    Review of Systems  13 point review of systems per patient health survey noted.  Negative other than as indicated above or in HPI.    Objective:   Vitals:   10/05/23 1352  BP:  98/60  Pulse: 77  Temp: 98 F (36.7 C)  TempSrc: Temporal  SpO2: 98%  Weight: 131 lb 9.6 oz (59.7 kg)  Height: 5' 7.25 (1.708 m)     Physical Exam Vitals reviewed.  Constitutional:      Appearance: She is well-developed.  HENT:     Head: Normocephalic and atraumatic.     Right Ear: External ear normal.     Left Ear: External ear normal.  Eyes:     Conjunctiva/sclera: Conjunctivae normal.     Pupils: Pupils are equal, round, and reactive to light.  Neck:     Thyroid : No thyromegaly.  Cardiovascular:     Rate and Rhythm: Normal rate and regular rhythm.     Heart sounds: Normal heart sounds. No murmur heard. Pulmonary:     Effort: Pulmonary effort is normal. No respiratory distress.     Breath sounds: Normal breath sounds. No wheezing.  Abdominal:     General: Bowel sounds are normal.     Palpations: Abdomen is soft.     Tenderness: There is no abdominal tenderness.  Musculoskeletal:        General: No tenderness. Normal range of motion.     Cervical back: Normal range of motion and neck supple.  Lymphadenopathy:     Cervical: No cervical adenopathy.  Skin:    General: Skin is warm and dry.     Findings: No rash.  Neurological:     Mental Status: She is alert and oriented to person, place, and time.  Psychiatric:        Behavior: Behavior normal.        Thought Content: Thought content normal.        Assessment & Plan:  April Russo is a 33 y.o. female . Annual physical exam  - -anticipatory guidance as  below in AVS, screening labs above. Health maintenance items as above in HPI discussed/recommended as applicable.   Hand dermatitis - Plan: triamcinolone  cream (KENALOG ) 0.1 %  - Stable with intermittent use of Kenalog .  Continue same.  Refilled.  Screening for diabetes mellitus - Plan: Comprehensive metabolic panel with GFR, Hemoglobin A1c  Screening for hyperlipidemia - Plan: Lipid panel  Screening for deficiency anemia - Plan: CBC  Thyroid  enlargement - Plan: TSH  Need for hepatitis B screening test - Plan: Hepatitis B surface antibody,qualitative  Need for hepatitis C screening test - Plan: Hepatitis C Antibody   Meds ordered this encounter  Medications   triamcinolone  cream (KENALOG ) 0.1 %    Sig: Apply 1 Application topically 2 (two) times daily. As needed    Dispense:  30 g    Refill:  1    Ok to place on hold if needed.   Patient Instructions  Thank you for coming in today. No change in medications at this time. If there are any concerns on your bloodwork, I will let you know. Take care!  Preventive Care 49-44 Years Old, Female Preventive care refers to lifestyle choices and visits with your health care provider that can promote health and wellness. Preventive care visits are also called wellness exams. What can I expect for my preventive care visit? Counseling During your preventive care visit, your health care provider may ask about your: Medical history, including: Past medical problems. Family medical history. Pregnancy history. Current health, including: Menstrual cycle. Method of birth control. Emotional well-being. Home life and relationship well-being. Sexual activity and sexual health. Lifestyle, including: Alcohol, nicotine or tobacco, and drug use. Access to  firearms. Diet, exercise, and sleep habits. Work and work Astronomer. Sunscreen use. Safety issues such as seatbelt and bike helmet use. Physical exam Your health care provider may check  your: Height and weight. These may be used to calculate your BMI (body mass index). BMI is a measurement that tells if you are at a healthy weight. Waist circumference. This measures the distance around your waistline. This measurement also tells if you are at a healthy weight and may help predict your risk of certain diseases, such as type 2 diabetes and high blood pressure. Heart rate and blood pressure. Body temperature. Skin for abnormal spots. What immunizations do I need?  Vaccines are usually given at various ages, according to a schedule. Your health care provider will recommend vaccines for you based on your age, medical history, and lifestyle or other factors, such as travel or where you work. What tests do I need? Screening Your health care provider may recommend screening tests for certain conditions. This may include: Pelvic exam and Pap test. Lipid and cholesterol levels. Diabetes screening. This is done by checking your blood sugar (glucose) after you have not eaten for a while (fasting). Hepatitis B test. Hepatitis C test. HIV (human immunodeficiency virus) test. STI (sexually transmitted infection) testing, if you are at risk. BRCA-related cancer screening. This may be done if you have a family history of breast, ovarian, tubal, or peritoneal cancers. Talk with your health care provider about your test results, treatment options, and if necessary, the need for more tests. Follow these instructions at home: Eating and drinking  Eat a healthy diet that includes fresh fruits and vegetables, whole grains, lean protein, and low-fat dairy products. Take vitamin and mineral supplements as recommended by your health care provider. Do not drink alcohol if: Your health care provider tells you not to drink. You are pregnant, may be pregnant, or are planning to become pregnant. If you drink alcohol: Limit how much you have to 0-1 drink a day. Know how much alcohol is in your  drink. In the [place] one drink equals one 12 oz bottle of beer (355 mL), one 5 oz glass of wine (148 mL), or one 1 oz glass of hard liquor (44 mL). Lifestyle Brush your teeth every morning and night with fluoride toothpaste. Floss one time each day. Exercise for at least 30 minutes 5 or more days each week. Do not use any products that contain nicotine or tobacco. These products include cigarettes, chewing tobacco, and vaping devices, such as e-cigarettes. If you need help quitting, ask your health care provider. Do not use drugs. If you are sexually active, practice safe sex. Use a condom or other form of protection to prevent STIs. If you do not wish to become pregnant, use a form of birth control. If you plan to become pregnant, see your health care provider for a prepregnancy visit. Find healthy ways to manage stress, such as: Meditation, yoga, or listening to music. Journaling. Talking to a trusted person. Spending time with friends and family. Minimize exposure to UV radiation to reduce your risk of skin cancer. Safety Always wear your seat belt while driving or riding in a vehicle. Do not drive: If you have been drinking alcohol. Do not ride with someone who has been drinking. If you have been using any mind-altering substances or drugs. While texting. When you are tired or distracted. Wear a helmet and other protective equipment during sports activities. If you have firearms in your house, make sure you  follow all gun safety procedures. Seek help if you have been physically or sexually abused. What's next? Go to your health care provider once a year for an annual wellness visit. Ask your health care provider how often you should have your eyes and teeth checked. Stay up to date on all vaccines. This information is not intended to replace advice given to you by your health care provider. Make sure you discuss any questions you have with your health care provider. Document Revised:  07/30/2020 Document Reviewed: 07/30/2020 Elsevier Patient Education  2024 [employer] Inc.     Signed,   Reyes Pines, MD [facility], [facility] 10/05/23 2:38 PM

## 2023-10-05 NOTE — Patient Instructions (Addendum)
 Thank you for coming in today. No change in medications at this time. If there are any concerns on your bloodwork, I will let you know. Take care! Preventive Care 63-32 Years Old, Female Preventive care refers to lifestyle choices and visits with your health care provider that can promote health and wellness. Preventive care visits are also called wellness exams. What can I expect for my preventive care visit? Counseling During your preventive care visit, your health care provider may ask about your: Medical history, including: Past medical problems. Family medical history. Pregnancy history. Current health, including: Menstrual cycle. Method of birth control. Emotional well-being. Home life and relationship well-being. Sexual activity and sexual health. Lifestyle, including: Alcohol, nicotine or tobacco, and drug use. Access to firearms. Diet, exercise, and sleep habits. Work and work Astronomer. Sunscreen use. Safety issues such as seatbelt and bike helmet use. Physical exam Your health care provider may check your: Height and weight. These may be used to calculate your BMI (body mass index). BMI is a measurement that tells if you are at a healthy weight. Waist circumference. This measures the distance around your waistline. This measurement also tells if you are at a healthy weight and may help predict your risk of certain diseases, such as type 2 diabetes and high blood pressure. Heart rate and blood pressure. Body temperature. Skin for abnormal spots. What immunizations do I need?  Vaccines are usually given at various ages, according to a schedule. Your health care provider will recommend vaccines for you based on your age, medical history, and lifestyle or other factors, such as travel or where you work. What tests do I need? Screening Your health care provider may recommend screening tests for certain conditions. This may include: Pelvic exam and Pap test. Lipid and  cholesterol levels. Diabetes screening. This is done by checking your blood sugar (glucose) after you have not eaten for a while (fasting). Hepatitis B test. Hepatitis C test. HIV (human immunodeficiency virus) test. STI (sexually transmitted infection) testing, if you are at risk. BRCA-related cancer screening. This may be done if you have a family history of breast, ovarian, tubal, or peritoneal cancers. Talk with your health care provider about your test results, treatment options, and if necessary, the need for more tests. Follow these instructions at home: Eating and drinking  Eat a healthy diet that includes fresh fruits and vegetables, whole grains, lean protein, and low-fat dairy products. Take vitamin and mineral supplements as recommended by your health care provider. Do not drink alcohol if: Your health care provider tells you not to drink. You are pregnant, may be pregnant, or are planning to become pregnant. If you drink alcohol: Limit how much you have to 0-1 drink a day. Know how much alcohol is in your drink. In the [place] one drink equals one 12 oz bottle of beer (355 mL), one 5 oz glass of wine (148 mL), or one 1 oz glass of hard liquor (44 mL). Lifestyle Brush your teeth every morning and night with fluoride toothpaste. Floss one time each day. Exercise for at least 30 minutes 5 or more days each week. Do not use any products that contain nicotine or tobacco. These products include cigarettes, chewing tobacco, and vaping devices, such as e-cigarettes. If you need help quitting, ask your health care provider. Do not use drugs. If you are sexually active, practice safe sex. Use a condom or other form of protection to prevent STIs. If you do not wish to become pregnant, use a  form of birth control. If you plan to become pregnant, see your health care provider for a prepregnancy visit. Find healthy ways to manage stress, such as: Meditation, yoga, or listening to  music. Journaling. Talking to a trusted person. Spending time with friends and family. Minimize exposure to UV radiation to reduce your risk of skin cancer. Safety Always wear your seat belt while driving or riding in a vehicle. Do not drive: If you have been drinking alcohol. Do not ride with someone who has been drinking. If you have been using any mind-altering substances or drugs. While texting. When you are tired or distracted. Wear a helmet and other protective equipment during sports activities. If you have firearms in your house, make sure you follow all gun safety procedures. Seek help if you have been physically or sexually abused. What's next? Go to your health care provider once a year for an annual wellness visit. Ask your health care provider how often you should have your eyes and teeth checked. Stay up to date on all vaccines. This information is not intended to replace advice given to you by your health care provider. Make sure you discuss any questions you have with your health care provider. Document Revised: 07/30/2020 Document Reviewed: 07/30/2020 Elsevier Patient Education  2024 [employer].

## 2023-10-06 LAB — COMPREHENSIVE METABOLIC PANEL WITH GFR
ALT: 15 U/L (ref 0–35)
AST: 21 U/L (ref 0–37)
Albumin: 4.8 g/dL (ref 3.5–5.2)
Alkaline Phosphatase: 43 U/L (ref 39–117)
BUN: 12 mg/dL (ref 6–23)
CO2: 28 meq/L (ref 19–32)
Calcium: 9.6 mg/dL (ref 8.4–10.5)
Chloride: 103 meq/L (ref 96–112)
Creatinine, Ser: 0.84 mg/dL (ref 0.40–1.20)
GFR: 92.4 mL/min (ref >60.00)
Glucose, Bld: 84 mg/dL (ref 70–99)
Potassium: 4.1 meq/L (ref 3.5–5.1)
Sodium: 140 meq/L (ref 135–145)
Total Bilirubin: 0.7 mg/dL (ref 0.2–1.2)
Total Protein: 7.4 g/dL (ref 6.0–8.3)

## 2023-10-06 LAB — TSH: TSH: 3.24 u[IU]/mL (ref 0.35–5.50)

## 2023-10-06 LAB — HEPATITIS B SURFACE ANTIBODY,QUALITATIVE: Hep B S Ab: REACTIVE — AB

## 2023-10-06 LAB — CBC
HCT: 40.7 % (ref 36.0–46.0)
Hemoglobin: 13.7 g/dL (ref 12.0–15.0)
MCHC: 33.7 g/dL (ref 30.0–36.0)
MCV: 86.5 fl (ref 78.0–100.0)
Platelets: 185 K/uL (ref 150.0–400.0)
RBC: 4.7 Mil/uL (ref 3.87–5.11)
RDW: 12.4 % (ref 11.5–15.5)
WBC: 8.5 K/uL (ref 4.0–10.5)

## 2023-10-06 LAB — HEMOGLOBIN A1C: Hgb A1c MFr Bld: 5.8 % (ref 4.6–6.5)

## 2023-10-06 LAB — LIPID PANEL
Cholesterol: 187 mg/dL (ref 0–200)
HDL: 79.9 mg/dL (ref >39.00)
LDL Cholesterol: 97 mg/dL (ref 0–99)
NonHDL: 107.1
Total CHOL/HDL Ratio: 2
Triglycerides: 50 mg/dL (ref 0.0–149.0)
VLDL: 10 mg/dL (ref 0.0–40.0)

## 2023-10-06 LAB — HEPATITIS C ANTIBODY: Hepatitis C Ab: NONREACTIVE

## 2023-10-11 ENCOUNTER — Ambulatory Visit: Payer: Self-pay | Admitting: Family Medicine

## 2023-10-19 ENCOUNTER — Encounter: Payer: Self-pay | Admitting: Family Medicine

## 2024-10-05 ENCOUNTER — Encounter: Admitting: Family Medicine
